# Patient Record
Sex: Female | Born: 1950 | Race: Black or African American | Hispanic: No | Marital: Married
Health system: Southern US, Community
[De-identification: ages and names within clinical notes are randomized; demographics above are authoritative.]

---

## 2005-03-30 ENCOUNTER — Other Ambulatory Visit: Payer: Self-pay

## 2005-03-30 ENCOUNTER — Emergency Department: Payer: Self-pay | Admitting: Emergency Medicine

## 2006-11-21 ENCOUNTER — Emergency Department: Payer: Self-pay | Admitting: Emergency Medicine

## 2006-11-29 ENCOUNTER — Ambulatory Visit: Payer: Self-pay | Admitting: Family Medicine

## 2006-12-07 ENCOUNTER — Other Ambulatory Visit: Payer: Self-pay

## 2006-12-07 ENCOUNTER — Ambulatory Visit: Payer: Self-pay | Admitting: Obstetrics and Gynecology

## 2006-12-12 ENCOUNTER — Ambulatory Visit: Payer: Self-pay | Admitting: Obstetrics and Gynecology

## 2007-09-04 ENCOUNTER — Ambulatory Visit: Payer: Self-pay | Admitting: Family Medicine

## 2007-10-01 ENCOUNTER — Ambulatory Visit: Payer: Self-pay | Admitting: Family Medicine

## 2007-11-14 ENCOUNTER — Ambulatory Visit: Payer: Self-pay | Admitting: Family Medicine

## 2007-12-01 ENCOUNTER — Ambulatory Visit: Payer: Self-pay | Admitting: Family Medicine

## 2008-02-27 ENCOUNTER — Ambulatory Visit: Payer: Self-pay | Admitting: Family Medicine

## 2008-07-24 ENCOUNTER — Ambulatory Visit: Payer: Self-pay | Admitting: General Surgery

## 2009-04-02 ENCOUNTER — Ambulatory Visit: Payer: Self-pay

## 2011-05-21 ENCOUNTER — Ambulatory Visit: Payer: Self-pay | Admitting: Family Medicine

## 2012-03-14 ENCOUNTER — Ambulatory Visit: Payer: Self-pay | Admitting: Family Medicine

## 2012-05-11 ENCOUNTER — Emergency Department: Payer: Self-pay | Admitting: Emergency Medicine

## 2012-05-11 LAB — COMPREHENSIVE METABOLIC PANEL
Albumin: 2.4 g/dL — ABNORMAL LOW (ref 3.4–5.0)
Anion Gap: 8 (ref 7–16)
BUN: 28 mg/dL — ABNORMAL HIGH (ref 7–18)
Chloride: 102 mmol/L (ref 98–107)
Glucose: 416 mg/dL — ABNORMAL HIGH (ref 65–99)
Potassium: 4 mmol/L (ref 3.5–5.1)
SGOT(AST): 77 U/L — ABNORMAL HIGH (ref 15–37)
Total Protein: 9 g/dL — ABNORMAL HIGH (ref 6.4–8.2)

## 2012-05-11 LAB — PROTIME-INR
INR: 1.3
Prothrombin Time: 16.4 secs — ABNORMAL HIGH (ref 11.5–14.7)

## 2012-05-11 LAB — CBC
HCT: 33.6 % — ABNORMAL LOW (ref 35.0–47.0)
HGB: 11.7 g/dL — ABNORMAL LOW (ref 12.0–16.0)
MCH: 32.8 pg (ref 26.0–34.0)
MCHC: 34.7 g/dL (ref 32.0–36.0)
RBC: 3.56 10*6/uL — ABNORMAL LOW (ref 3.80–5.20)

## 2012-05-11 LAB — URINALYSIS, COMPLETE
Bilirubin,UR: NEGATIVE
Blood: NEGATIVE
Glucose,UR: >=500 mg/dL (ref 0–75)
Ketone: NEGATIVE
Leukocyte Esterase: NEGATIVE
Nitrite: NEGATIVE
Ph: 6 (ref 4.5–8.0)
Protein: NEGATIVE
Squamous Epithelial: 6

## 2012-10-15 LAB — DRUG SCREEN, URINE
Amphetamines, Ur Screen: NEGATIVE (ref ?–1000)
Benzodiazepine, Ur Scrn: NEGATIVE (ref ?–200)
Cocaine Metabolite,Ur ~~LOC~~: NEGATIVE (ref ?–300)
Methadone, Ur Screen: NEGATIVE (ref ?–300)
Opiate, Ur Screen: NEGATIVE (ref ?–300)
Tricyclic, Ur Screen: NEGATIVE (ref ?–1000)

## 2012-10-15 LAB — COMPREHENSIVE METABOLIC PANEL
Albumin: 2.4 g/dL — ABNORMAL LOW (ref 3.4–5.0)
Alkaline Phosphatase: 262 U/L — ABNORMAL HIGH (ref 50–136)
Anion Gap: 13 (ref 7–16)
BUN: 32 mg/dL — ABNORMAL HIGH (ref 7–18)
Bilirubin,Total: 1.5 mg/dL — ABNORMAL HIGH (ref 0.2–1.0)
Calcium, Total: 8.6 mg/dL (ref 8.5–10.1)
Creatinine: 1.43 mg/dL — ABNORMAL HIGH (ref 0.60–1.30)
EGFR (African American): 46 — ABNORMAL LOW
EGFR (Non-African Amer.): 40 — ABNORMAL LOW
Glucose: 119 mg/dL — ABNORMAL HIGH (ref 65–99)

## 2012-10-15 LAB — URINALYSIS, COMPLETE
Ketone: NEGATIVE
Nitrite: NEGATIVE
Ph: 6 (ref 4.5–8.0)
Protein: NEGATIVE
RBC,UR: 4 /HPF (ref 0–5)
Specific Gravity: 1.011 (ref 1.003–1.030)
Squamous Epithelial: <1

## 2012-10-16 ENCOUNTER — Inpatient Hospital Stay: Payer: Self-pay | Admitting: Internal Medicine

## 2012-10-16 LAB — CBC
HGB: 10.3 g/dL — ABNORMAL LOW (ref 12.0–16.0)
MCHC: 32.9 g/dL (ref 32.0–36.0)
MCV: 93 fL (ref 80–100)
Platelet: 168 10*3/uL (ref 150–440)
RBC: 3.36 10*6/uL — ABNORMAL LOW (ref 3.80–5.20)

## 2012-10-16 LAB — ACETAMINOPHEN LEVEL: Acetaminophen: <2 ug/mL

## 2012-10-16 LAB — ETHANOL: Ethanol %: <0.003 % (ref 0.000–0.080)

## 2012-10-16 LAB — TROPONIN I: Troponin-I: <0.02 ng/mL

## 2012-10-17 LAB — COMPREHENSIVE METABOLIC PANEL
Albumin: 2.1 g/dL — ABNORMAL LOW (ref 3.4–5.0)
Bilirubin,Total: 1.7 mg/dL — ABNORMAL HIGH (ref 0.2–1.0)
Calcium, Total: 8.5 mg/dL (ref 8.5–10.1)
Co2: 22 mmol/L (ref 21–32)
EGFR (Non-African Amer.): 44 — ABNORMAL LOW
Glucose: 139 mg/dL — ABNORMAL HIGH (ref 65–99)
Osmolality: 293 (ref 275–301)
Potassium: 3.6 mmol/L (ref 3.5–5.1)
SGOT(AST): 263 U/L — ABNORMAL HIGH (ref 15–37)
SGPT (ALT): 120 U/L — ABNORMAL HIGH (ref 12–78)
Sodium: 142 mmol/L (ref 136–145)
Total Protein: 7.6 g/dL (ref 6.4–8.2)

## 2012-10-17 LAB — CBC WITH DIFFERENTIAL/PLATELET
Basophil #: 0.2 10*3/uL — ABNORMAL HIGH (ref 0.0–0.1)
Eosinophil #: 0.1 10*3/uL (ref 0.0–0.7)
Eosinophil %: 0.6 %
HCT: 30.9 % — ABNORMAL LOW (ref 35.0–47.0)
Lymphocyte %: 6.9 %
MCV: 93 fL (ref 80–100)
Monocyte #: 1.5 x10 3/mm — ABNORMAL HIGH (ref 0.2–0.9)
Neutrophil #: 11 10*3/uL — ABNORMAL HIGH (ref 1.4–6.5)
Neutrophil %: 80.4 %
RDW: 13.3 % (ref 11.5–14.5)

## 2012-10-17 LAB — PROTIME-INR
INR: 1.4
Prothrombin Time: 16.7 secs — ABNORMAL HIGH (ref 11.5–14.7)

## 2012-10-18 LAB — CBC WITH DIFFERENTIAL/PLATELET
Eosinophil %: 0.1 %
HGB: 10.6 g/dL — ABNORMAL LOW (ref 12.0–16.0)
Lymphocyte #: 1.4 10*3/uL (ref 1.0–3.6)
MCHC: 33.6 g/dL (ref 32.0–36.0)
MCV: 94 fL (ref 80–100)
Monocyte #: 1.2 x10 3/mm — ABNORMAL HIGH (ref 0.2–0.9)
Monocyte %: 8.1 %
RBC: 3.37 10*6/uL — ABNORMAL LOW (ref 3.80–5.20)
WBC: 15.2 10*3/uL — ABNORMAL HIGH (ref 3.6–11.0)

## 2012-10-18 LAB — COMPREHENSIVE METABOLIC PANEL
Alkaline Phosphatase: 173 U/L — ABNORMAL HIGH (ref 50–136)
BUN: 36 mg/dL — ABNORMAL HIGH (ref 7–18)
Chloride: 110 mmol/L — ABNORMAL HIGH (ref 98–107)
Co2: 23 mmol/L (ref 21–32)
Creatinine: 1.25 mg/dL (ref 0.60–1.30)
EGFR (Non-African Amer.): 47 — ABNORMAL LOW
Osmolality: 303 (ref 275–301)
SGOT(AST): 207 U/L — ABNORMAL HIGH (ref 15–37)
SGPT (ALT): 106 U/L — ABNORMAL HIGH (ref 12–78)
Sodium: 143 mmol/L (ref 136–145)

## 2012-10-18 LAB — AMMONIA: Ammonia, Plasma: 52 mcmol/L — ABNORMAL HIGH (ref 11–32)

## 2012-10-19 LAB — TSH: Thyroid Stimulating Horm: 0.956 u[IU]/mL

## 2012-10-19 LAB — COMPREHENSIVE METABOLIC PANEL
Albumin: 2.1 g/dL — ABNORMAL LOW (ref 3.4–5.0)
Alkaline Phosphatase: 168 U/L — ABNORMAL HIGH (ref 50–136)
BUN: 34 mg/dL — ABNORMAL HIGH (ref 7–18)
Calcium, Total: 8.2 mg/dL — ABNORMAL LOW (ref 8.5–10.1)
Chloride: 117 mmol/L — ABNORMAL HIGH (ref 98–107)
Co2: 23 mmol/L (ref 21–32)
Creatinine: 1.45 mg/dL — ABNORMAL HIGH (ref 0.60–1.30)
Glucose: 195 mg/dL — ABNORMAL HIGH (ref 65–99)
Osmolality: 311 (ref 275–301)
Potassium: 3 mmol/L — ABNORMAL LOW (ref 3.5–5.1)
SGOT(AST): 225 U/L — ABNORMAL HIGH (ref 15–37)
SGPT (ALT): 110 U/L — ABNORMAL HIGH (ref 12–78)

## 2012-10-19 LAB — POTASSIUM: Potassium: 3.8 mmol/L (ref 3.5–5.1)

## 2012-10-20 LAB — COMPREHENSIVE METABOLIC PANEL
Albumin: 1.8 g/dL — ABNORMAL LOW (ref 3.4–5.0)
Anion Gap: 8 (ref 7–16)
BUN: 28 mg/dL — ABNORMAL HIGH (ref 7–18)
Chloride: 121 mmol/L — ABNORMAL HIGH (ref 98–107)
Creatinine: 1.34 mg/dL — ABNORMAL HIGH (ref 0.60–1.30)
Glucose: 275 mg/dL — ABNORMAL HIGH (ref 65–99)
Osmolality: 315 (ref 275–301)
Potassium: 3.3 mmol/L — ABNORMAL LOW (ref 3.5–5.1)
SGPT (ALT): 98 U/L — ABNORMAL HIGH (ref 12–78)
Sodium: 151 mmol/L — ABNORMAL HIGH (ref 136–145)

## 2012-10-20 LAB — BASIC METABOLIC PANEL
Anion Gap: 7 (ref 7–16)
Calcium, Total: 8 mg/dL — ABNORMAL LOW (ref 8.5–10.1)
Chloride: 121 mmol/L — ABNORMAL HIGH (ref 98–107)
Co2: 22 mmol/L (ref 21–32)
Creatinine: 1.26 mg/dL (ref 0.60–1.30)
EGFR (African American): 54 — ABNORMAL LOW
Glucose: 327 mg/dL — ABNORMAL HIGH (ref 65–99)
Potassium: 3.8 mmol/L (ref 3.5–5.1)
Sodium: 150 mmol/L — ABNORMAL HIGH (ref 136–145)

## 2012-10-20 LAB — POTASSIUM: Potassium: 4.4 mmol/L (ref 3.5–5.1)

## 2012-10-20 LAB — MAGNESIUM: Magnesium: 1.6 mg/dL — ABNORMAL LOW

## 2012-10-21 LAB — CBC WITH DIFFERENTIAL/PLATELET
Basophil #: 0.1 10*3/uL (ref 0.0–0.1)
Basophil %: 0.4 %
Eosinophil #: 0.7 10*3/uL (ref 0.0–0.7)
Eosinophil %: 4.1 %
HCT: 33.4 % — ABNORMAL LOW (ref 35.0–47.0)
Lymphocyte %: 15.2 %
MCH: 31.8 pg (ref 26.0–34.0)
MCHC: 32.7 g/dL (ref 32.0–36.0)
MCV: 97 fL (ref 80–100)
Monocyte %: 8.4 %
Neutrophil #: 12.8 10*3/uL — ABNORMAL HIGH (ref 1.4–6.5)
Neutrophil %: 71.9 %
RDW: 14 % (ref 11.5–14.5)

## 2012-10-21 LAB — BASIC METABOLIC PANEL WITH GFR
Anion Gap: 9
BUN: 21 mg/dL — ABNORMAL HIGH
Calcium, Total: 8.1 mg/dL — ABNORMAL LOW
Chloride: 119 mmol/L — ABNORMAL HIGH
Co2: 21 mmol/L
Creatinine: 1.22 mg/dL
EGFR (African American): 56 — ABNORMAL LOW
EGFR (Non-African Amer.): 48 — ABNORMAL LOW
Glucose: 215 mg/dL — ABNORMAL HIGH
Osmolality: 306
Potassium: 3.4 mmol/L — ABNORMAL LOW
Sodium: 149 mmol/L — ABNORMAL HIGH

## 2012-10-21 LAB — CULTURE, BLOOD (SINGLE)

## 2012-10-21 LAB — APTT: Activated PTT: 26.1 secs (ref 23.6–35.9)

## 2012-10-21 LAB — PROTIME-INR: INR: 1.5

## 2012-10-21 LAB — POTASSIUM: Potassium: 5.3 mmol/L — ABNORMAL HIGH (ref 3.5–5.1)

## 2012-10-22 LAB — URINALYSIS, COMPLETE
Bilirubin,UR: NEGATIVE
Glucose,UR: 150 mg/dL (ref 0–75)
Ketone: NEGATIVE
Ph: 5 (ref 4.5–8.0)
RBC,UR: 18 /HPF (ref 0–5)
Specific Gravity: 1.03 (ref 1.003–1.030)
Squamous Epithelial: 1
WBC UR: 5 /HPF (ref 0–5)

## 2012-10-22 LAB — BASIC METABOLIC PANEL
BUN: 18 mg/dL (ref 7–18)
Chloride: 112 mmol/L — ABNORMAL HIGH (ref 98–107)
Creatinine: 1.19 mg/dL (ref 0.60–1.30)
EGFR (African American): 57 — ABNORMAL LOW
Glucose: 242 mg/dL — ABNORMAL HIGH (ref 65–99)
Osmolality: 289 (ref 275–301)
Potassium: 3.6 mmol/L (ref 3.5–5.1)
Sodium: 140 mmol/L (ref 136–145)

## 2012-10-23 LAB — COMPREHENSIVE METABOLIC PANEL
Albumin: 1.5 g/dL — ABNORMAL LOW (ref 3.4–5.0)
Alkaline Phosphatase: 143 U/L — ABNORMAL HIGH (ref 50–136)
Anion Gap: 6 — ABNORMAL LOW (ref 7–16)
BUN: 16 mg/dL (ref 7–18)
Calcium, Total: 7.3 mg/dL — ABNORMAL LOW (ref 8.5–10.1)
Chloride: 108 mmol/L — ABNORMAL HIGH (ref 98–107)
Co2: 21 mmol/L (ref 21–32)
Creatinine: 1.38 mg/dL — ABNORMAL HIGH (ref 0.60–1.30)
EGFR (African American): 48 — ABNORMAL LOW
Osmolality: 274 (ref 275–301)
Potassium: 3.7 mmol/L (ref 3.5–5.1)
SGOT(AST): 82 U/L — ABNORMAL HIGH (ref 15–37)
SGPT (ALT): 72 U/L (ref 12–78)
Sodium: 135 mmol/L — ABNORMAL LOW (ref 136–145)
Total Protein: 6.5 g/dL (ref 6.4–8.2)

## 2012-10-23 LAB — CBC WITH DIFFERENTIAL/PLATELET
Eosinophil %: 3 %
HCT: 28 % — ABNORMAL LOW (ref 35.0–47.0)
Lymphocyte #: 2.2 10*3/uL (ref 1.0–3.6)
MCV: 94 fL (ref 80–100)
Neutrophil #: 9.4 10*3/uL — ABNORMAL HIGH (ref 1.4–6.5)
Neutrophil %: 67.6 %
Platelet: 94 10*3/uL — ABNORMAL LOW (ref 150–440)
RDW: 13.7 % (ref 11.5–14.5)
WBC: 13.8 10*3/uL — ABNORMAL HIGH (ref 3.6–11.0)

## 2012-10-24 LAB — FOLATE: Folic Acid: 11.1 ng/mL (ref 3.1–100.0)

## 2012-10-24 LAB — CULTURE, BLOOD (SINGLE)

## 2012-10-26 LAB — PHOSPHORUS: Phosphorus: 2.2 mg/dL — ABNORMAL LOW (ref 2.5–4.9)

## 2012-10-26 LAB — BASIC METABOLIC PANEL
Anion Gap: 6 — ABNORMAL LOW (ref 7–16)
BUN: 29 mg/dL — ABNORMAL HIGH (ref 7–18)
Calcium, Total: 8.1 mg/dL — ABNORMAL LOW (ref 8.5–10.1)
Co2: 21 mmol/L (ref 21–32)
Creatinine: 1.49 mg/dL — ABNORMAL HIGH (ref 0.60–1.30)
EGFR (African American): 44 — ABNORMAL LOW
EGFR (Non-African Amer.): 38 — ABNORMAL LOW
Osmolality: 285 (ref 275–301)
Sodium: 139 mmol/L (ref 136–145)

## 2012-12-10 ENCOUNTER — Inpatient Hospital Stay: Payer: Self-pay | Admitting: Internal Medicine

## 2012-12-10 LAB — COMPREHENSIVE METABOLIC PANEL
Alkaline Phosphatase: 264 U/L — ABNORMAL HIGH (ref 50–136)
Anion Gap: 6 — ABNORMAL LOW (ref 7–16)
Calcium, Total: 7.9 mg/dL — ABNORMAL LOW (ref 8.5–10.1)
Co2: 18 mmol/L — ABNORMAL LOW (ref 21–32)
EGFR (Non-African Amer.): 22 — ABNORMAL LOW
Glucose: 32 mg/dL — CL (ref 65–99)
Osmolality: 273 (ref 275–301)
Potassium: 5 mmol/L (ref 3.5–5.1)
SGOT(AST): 66 U/L — ABNORMAL HIGH (ref 15–37)
SGPT (ALT): 15 U/L (ref 12–78)
Sodium: 136 mmol/L (ref 136–145)
Total Protein: 8.7 g/dL — ABNORMAL HIGH (ref 6.4–8.2)

## 2012-12-10 LAB — CBC WITH DIFFERENTIAL/PLATELET
Basophil #: 0.1 10*3/uL (ref 0.0–0.1)
Eosinophil #: 0.1 10*3/uL (ref 0.0–0.7)
HCT: 26.6 % — ABNORMAL LOW (ref 35.0–47.0)
Lymphocyte %: 11.4 %
MCH: 30.5 pg (ref 26.0–34.0)
Monocyte #: 1.1 x10 3/mm — ABNORMAL HIGH (ref 0.2–0.9)
Monocyte %: 8.6 %
Neutrophil #: 10.4 10*3/uL — ABNORMAL HIGH (ref 1.4–6.5)
Platelet: 161 10*3/uL (ref 150–440)
RBC: 2.86 10*6/uL — ABNORMAL LOW (ref 3.80–5.20)
WBC: 13.2 10*3/uL — ABNORMAL HIGH (ref 3.6–11.0)

## 2012-12-10 LAB — LIPASE, BLOOD: Lipase: 759 U/L — ABNORMAL HIGH (ref 73–393)

## 2012-12-10 LAB — TROPONIN I: Troponin-I: <0.02 ng/mL

## 2012-12-10 LAB — PROTIME-INR: INR: 1.4

## 2012-12-10 LAB — PRO B NATRIURETIC PEPTIDE: B-Type Natriuretic Peptide: 192 pg/mL — ABNORMAL HIGH (ref 0–125)

## 2012-12-11 LAB — COMPREHENSIVE METABOLIC PANEL
Alkaline Phosphatase: 193 U/L — ABNORMAL HIGH (ref 50–136)
BUN: 28 mg/dL — ABNORMAL HIGH (ref 7–18)
Bilirubin,Total: 1.3 mg/dL — ABNORMAL HIGH (ref 0.2–1.0)
Calcium, Total: 7.9 mg/dL — ABNORMAL LOW (ref 8.5–10.1)
Chloride: 111 mmol/L — ABNORMAL HIGH (ref 98–107)
Co2: 16 mmol/L — ABNORMAL LOW (ref 21–32)
Creatinine: 2.49 mg/dL — ABNORMAL HIGH (ref 0.60–1.30)
EGFR (African American): 24 — ABNORMAL LOW
EGFR (Non-African Amer.): 20 — ABNORMAL LOW
Glucose: 95 mg/dL (ref 65–99)
Osmolality: 277 (ref 275–301)
Potassium: 5.2 mmol/L — ABNORMAL HIGH (ref 3.5–5.1)
SGPT (ALT): 11 U/L — ABNORMAL LOW (ref 12–78)
Total Protein: 7.1 g/dL (ref 6.4–8.2)

## 2012-12-11 LAB — PHOSPHORUS: Phosphorus: 3 mg/dL (ref 2.5–4.9)

## 2012-12-11 LAB — PROTEIN / CREATININE RATIO, URINE
Creatinine, Urine: 222.7 mg/dL — ABNORMAL HIGH (ref 30.0–125.0)
Protein, Random Urine: 69 mg/dL — ABNORMAL HIGH (ref 0–12)
Protein/Creat. Ratio: 310 mg/gCREAT — ABNORMAL HIGH (ref 0–200)

## 2012-12-11 LAB — CBC WITH DIFFERENTIAL/PLATELET
Basophil #: 0.1 10*3/uL (ref 0.0–0.1)
Basophil %: 0.4 %
Eosinophil #: 0.1 10*3/uL (ref 0.0–0.7)
MCH: 31.3 pg (ref 26.0–34.0)
Monocyte #: 2 x10 3/mm — ABNORMAL HIGH (ref 0.2–0.9)
Neutrophil #: 9.1 10*3/uL — ABNORMAL HIGH (ref 1.4–6.5)
Neutrophil %: 62.7 %
Platelet: 153 10*3/uL (ref 150–440)
RBC: 2.5 10*6/uL — ABNORMAL LOW (ref 3.80–5.20)
RDW: 13.8 % (ref 11.5–14.5)
WBC: 14.6 10*3/uL — ABNORMAL HIGH (ref 3.6–11.0)

## 2012-12-11 LAB — BODY FLUID CELL COUNT WITH DIFFERENTIAL
Basophil: 0 %
Eosinophil: 0 %
Lymphocytes: 0 %
Neutrophils: 84 %
Nucleated Cell Count: 9183 /mm3
Other Mononuclear Cells: 16 %

## 2012-12-11 LAB — URINALYSIS, COMPLETE
Bilirubin,UR: NEGATIVE
Glucose,UR: NEGATIVE mg/dL (ref 0–75)
Hyaline Cast: 21
Ph: 5 (ref 4.5–8.0)
Protein: NEGATIVE
Squamous Epithelial: <1
WBC UR: 12 /HPF (ref 0–5)

## 2012-12-11 LAB — LACTATE DEHYDROGENASE, PLEURAL OR PERITONEAL FLUID: LDH, Body Fluid: 102 U/L

## 2012-12-11 LAB — PROTEIN, BODY FLUID: Protein, Body Fluid: 0.7 g/dL

## 2012-12-11 LAB — GLUCOSE, SEROUS FLUID: Glucose, Body Fluid: 106 mg/dL

## 2012-12-11 LAB — PROTIME-INR
INR: 1.7
Prothrombin Time: 19.5 secs — ABNORMAL HIGH (ref 11.5–14.7)

## 2012-12-11 LAB — HEMOGLOBIN A1C: Hemoglobin A1C: 5.9 % (ref 4.2–6.3)

## 2012-12-12 LAB — BASIC METABOLIC PANEL
BUN: 30 mg/dL — ABNORMAL HIGH (ref 7–18)
Calcium, Total: 7.9 mg/dL — ABNORMAL LOW (ref 8.5–10.1)
EGFR (African American): 23 — ABNORMAL LOW
EGFR (Non-African Amer.): 20 — ABNORMAL LOW
Glucose: 185 mg/dL — ABNORMAL HIGH (ref 65–99)
Osmolality: 281 (ref 275–301)
Sodium: 135 mmol/L — ABNORMAL LOW (ref 136–145)

## 2012-12-12 LAB — CBC WITH DIFFERENTIAL/PLATELET
Basophil %: 0.4 %
Eosinophil #: 0.3 10*3/uL (ref 0.0–0.7)
HCT: 19.8 % — ABNORMAL LOW (ref 35.0–47.0)
Lymphocyte #: 2.3 10*3/uL (ref 1.0–3.6)
Lymphocyte %: 22.2 %
MCV: 92 fL (ref 80–100)
Monocyte #: 1.7 x10 3/mm — ABNORMAL HIGH (ref 0.2–0.9)
Monocyte %: 16.6 %
Neutrophil %: 58.1 %
Platelet: 128 10*3/uL — ABNORMAL LOW (ref 150–440)
RDW: 13.7 % (ref 11.5–14.5)
WBC: 10.5 10*3/uL (ref 3.6–11.0)

## 2012-12-12 LAB — UR PROT ELECTROPHORESIS, URINE RANDOM

## 2012-12-12 LAB — CLOSTRIDIUM DIFFICILE BY PCR

## 2012-12-12 LAB — CREATININE, URINE, 24 HOUR
Creatinine, Urine: 178.3 mg/dL — ABNORMAL HIGH (ref 30.0–125.0)
Total Volume: 400 mL

## 2012-12-13 LAB — CBC WITH DIFFERENTIAL/PLATELET
Basophil %: 0.4 %
Eosinophil #: 0.5 10*3/uL (ref 0.0–0.7)
MCH: 30.9 pg (ref 26.0–34.0)
MCV: 89 fL (ref 80–100)
Monocyte #: 1.5 x10 3/mm — ABNORMAL HIGH (ref 0.2–0.9)
Monocyte %: 13.9 %
Neutrophil #: 6.6 10*3/uL — ABNORMAL HIGH (ref 1.4–6.5)
Platelet: 135 10*3/uL — ABNORMAL LOW (ref 150–440)
RBC: 2.55 10*6/uL — ABNORMAL LOW (ref 3.80–5.20)

## 2012-12-13 LAB — BASIC METABOLIC PANEL
BUN: 29 mg/dL — ABNORMAL HIGH (ref 7–18)
Calcium, Total: 8.1 mg/dL — ABNORMAL LOW (ref 8.5–10.1)
Chloride: 109 mmol/L — ABNORMAL HIGH (ref 98–107)
Co2: 21 mmol/L (ref 21–32)
Creatinine: 2.25 mg/dL — ABNORMAL HIGH (ref 0.60–1.30)
EGFR (Non-African Amer.): 23 — ABNORMAL LOW
Glucose: 106 mg/dL — ABNORMAL HIGH (ref 65–99)
Osmolality: 278 (ref 275–301)
Sodium: 136 mmol/L (ref 136–145)

## 2012-12-13 LAB — PROTEIN ELECTROPHORESIS(ARMC)

## 2012-12-14 LAB — CBC WITH DIFFERENTIAL/PLATELET
Basophil #: 0 10*3/uL (ref 0.0–0.1)
Basophil %: 0.1 %
Eosinophil #: 0.1 10*3/uL (ref 0.0–0.7)
HCT: 21.3 % — ABNORMAL LOW (ref 35.0–47.0)
HGB: 7.3 g/dL — ABNORMAL LOW (ref 12.0–16.0)
Lymphocyte #: 1.6 10*3/uL (ref 1.0–3.6)
Lymphocyte %: 11.1 %
MCH: 30.6 pg (ref 26.0–34.0)
MCHC: 34.4 g/dL (ref 32.0–36.0)
MCV: 89 fL (ref 80–100)
Monocyte #: 1.5 x10 3/mm — ABNORMAL HIGH (ref 0.2–0.9)
Monocyte %: 10.6 %
Neutrophil #: 11.1 10*3/uL — ABNORMAL HIGH (ref 1.4–6.5)
Neutrophil %: 77.6 %

## 2012-12-14 LAB — BASIC METABOLIC PANEL
Calcium, Total: 8.3 mg/dL — ABNORMAL LOW (ref 8.5–10.1)
Chloride: 111 mmol/L — ABNORMAL HIGH (ref 98–107)
Co2: 19 mmol/L — ABNORMAL LOW (ref 21–32)
EGFR (African American): 28 — ABNORMAL LOW
Osmolality: 284 (ref 275–301)
Sodium: 139 mmol/L (ref 136–145)

## 2012-12-15 LAB — BASIC METABOLIC PANEL
Anion Gap: 10 (ref 7–16)
BUN: 29 mg/dL — ABNORMAL HIGH (ref 7–18)
BUN: 26 mg/dL — ABNORMAL HIGH (ref 7–18)
Calcium, Total: 8.4 mg/dL — ABNORMAL LOW (ref 8.5–10.1)
Calcium, Total: 8.1 mg/dL — ABNORMAL LOW (ref 8.5–10.1)
Co2: 21 mmol/L (ref 21–32)
Co2: 20 mmol/L — ABNORMAL LOW (ref 21–32)
EGFR (African American): 26 — ABNORMAL LOW
EGFR (African American): 30 — ABNORMAL LOW
EGFR (Non-African Amer.): 23 — ABNORMAL LOW
Glucose: 29 mg/dL — CL (ref 65–99)
Glucose: 186 mg/dL — ABNORMAL HIGH (ref 65–99)
Potassium: 4.3 mmol/L (ref 3.5–5.1)
Sodium: 138 mmol/L (ref 136–145)
Sodium: 137 mmol/L (ref 136–145)

## 2012-12-15 LAB — CBC WITH DIFFERENTIAL/PLATELET
Basophil #: 0 10*3/uL (ref 0.0–0.1)
Basophil %: 0.3 %
Eosinophil #: 0.2 10*3/uL (ref 0.0–0.7)
Eosinophil %: 1.1 %
HGB: 7.9 g/dL — ABNORMAL LOW (ref 12.0–16.0)
Lymphocyte #: 1.7 10*3/uL (ref 1.0–3.6)
Lymphocyte %: 11.2 %
MCHC: 33.2 g/dL (ref 32.0–36.0)
RDW: 15 % — ABNORMAL HIGH (ref 11.5–14.5)

## 2012-12-16 LAB — CBC WITH DIFFERENTIAL/PLATELET
Basophil #: 0.2 10*3/uL — ABNORMAL HIGH (ref 0.0–0.1)
Basophil %: 0.7 %
Eosinophil #: 0.3 10*3/uL (ref 0.0–0.7)
Eosinophil %: 1.1 %
HGB: 9.1 g/dL — ABNORMAL LOW (ref 12.0–16.0)
MCV: 93 fL (ref 80–100)
Monocyte #: 1.9 x10 3/mm — ABNORMAL HIGH (ref 0.2–0.9)
Monocyte %: 6.6 %
Neutrophil %: 82.5 %
Platelet: 161 10*3/uL (ref 150–440)
RBC: 2.84 10*6/uL — ABNORMAL LOW (ref 3.80–5.20)
WBC: 29.3 10*3/uL — ABNORMAL HIGH (ref 3.6–11.0)

## 2012-12-16 LAB — COMPREHENSIVE METABOLIC PANEL
Anion Gap: 9 (ref 7–16)
Bilirubin,Total: 2.2 mg/dL — ABNORMAL HIGH (ref 0.2–1.0)
Creatinine: 2.05 mg/dL — ABNORMAL HIGH (ref 0.60–1.30)
EGFR (African American): 30 — ABNORMAL LOW
Glucose: 330 mg/dL — ABNORMAL HIGH (ref 65–99)
Osmolality: 279 (ref 275–301)
Total Protein: 7 g/dL (ref 6.4–8.2)

## 2012-12-31 DEATH — deceased

## 2014-06-10 IMAGING — CT CT ABD-PELV W/O CM
1 of 2 series · 14 of 32 positions shown, 18 images · non-contrast
Comparison: none

REASON FOR EXAM: (1) known liver disease with mass /sarcoidosis, s/p
recent microwave ablation [place]
COMMENTS:

PROCEDURE:     CT  - CT ABDOMEN AND PELVIS W[DATE]  [DATE]
RESULT:     Comparison: None
TECHNIQUE: Multiple axial images from the lung bases to the symphysis pubis
were obtained without oral and without intravenous contrast.

[Series 2: 3mm soft tissue · axial · 1.27mm/px · z∈[-503,-44]mm · 14 of 167 slices shown, 18 images]
[im 7/167  soft-tissue]
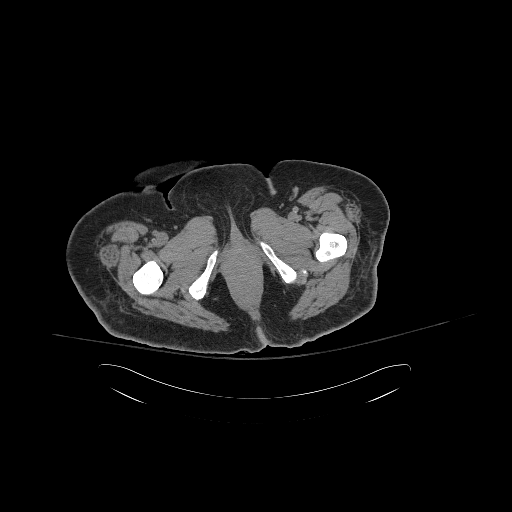
[im 7/167  bone]
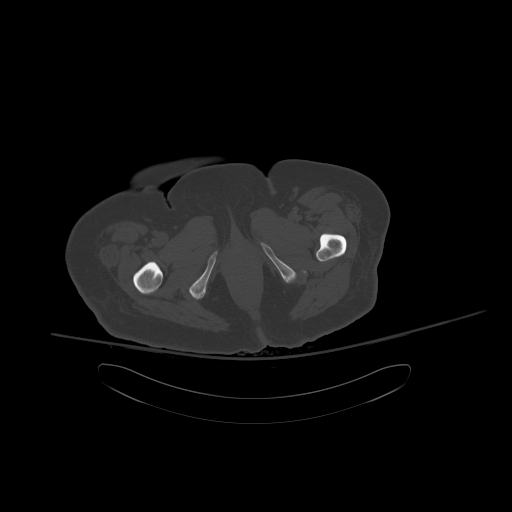
[im 21/167  soft-tissue]
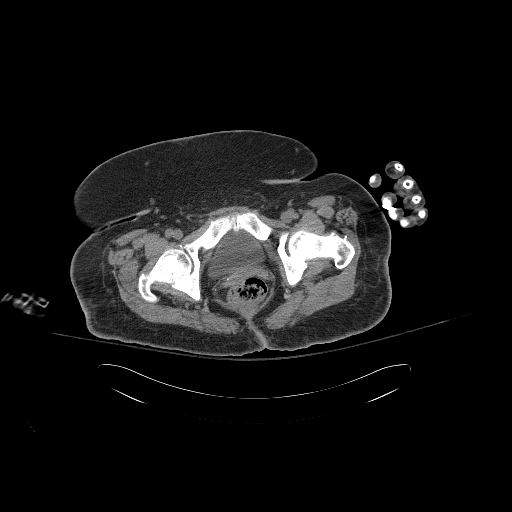
[im 35/167  soft-tissue]
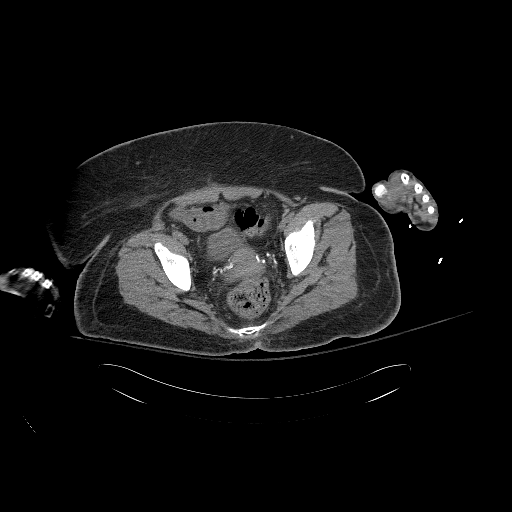
[im 49/167  soft-tissue]
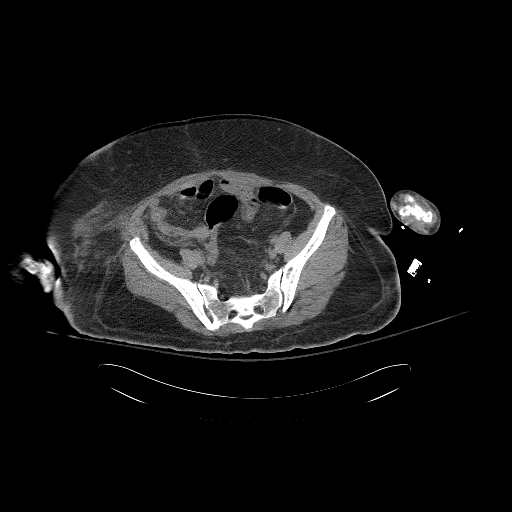
[im 63/167  soft-tissue]
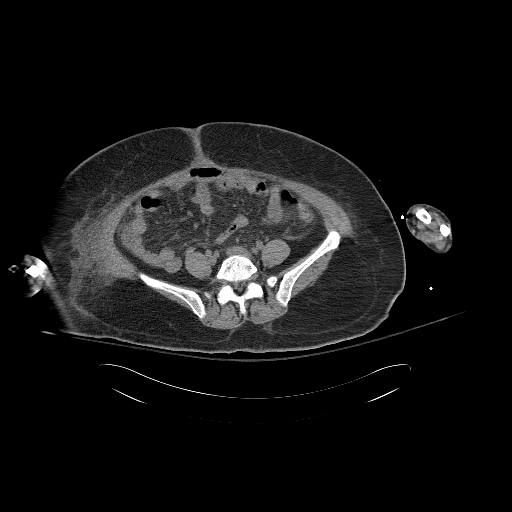
[im 77/167  soft-tissue]
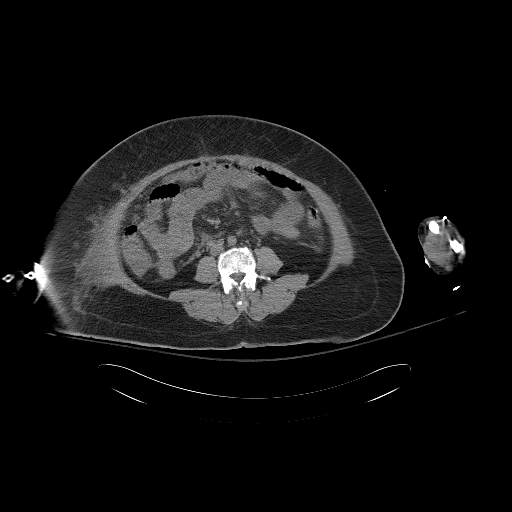
[im 90/167  soft-tissue]
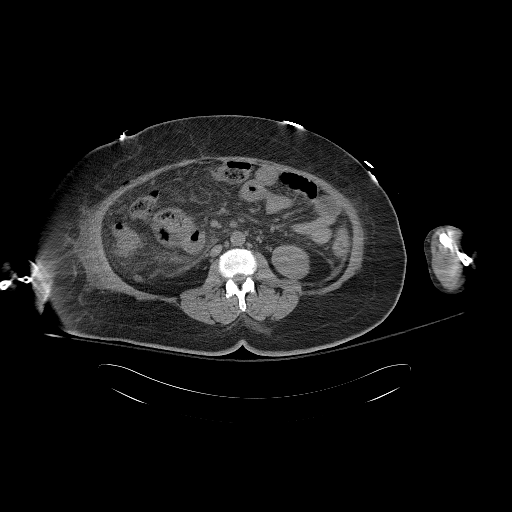
[im 104/167  soft-tissue]
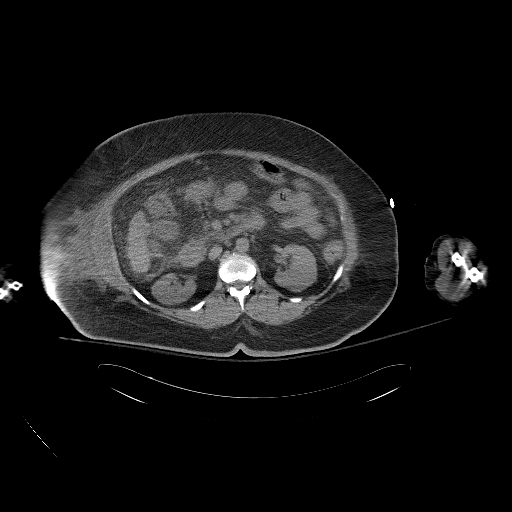
[im 118/167  soft-tissue]
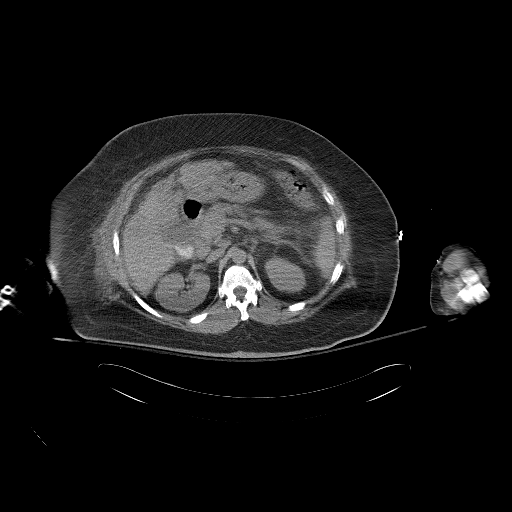
[im 118/167  bone]
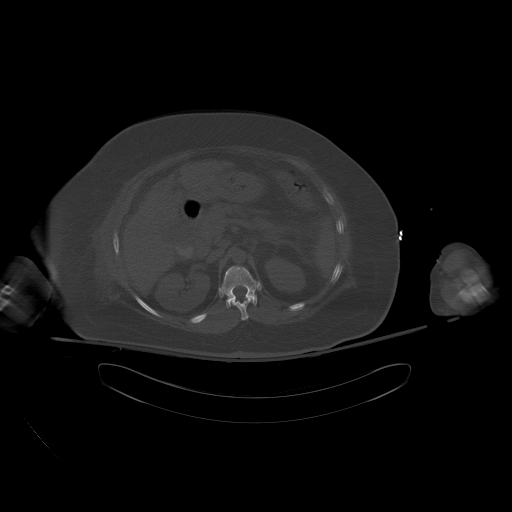
[im 132/167  soft-tissue]
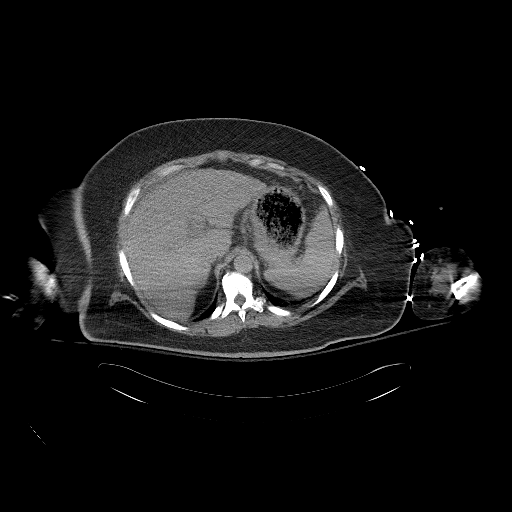
[im 139/167  lung]
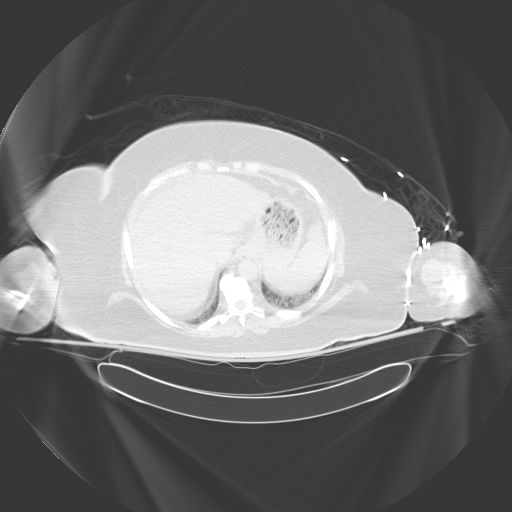
[im 146/167  soft-tissue]
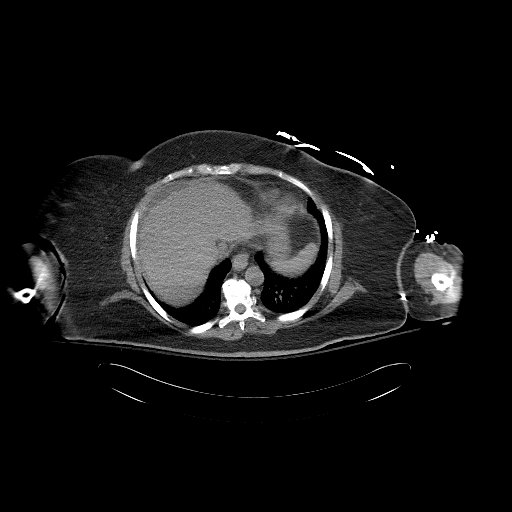
[im 146/167  lung]
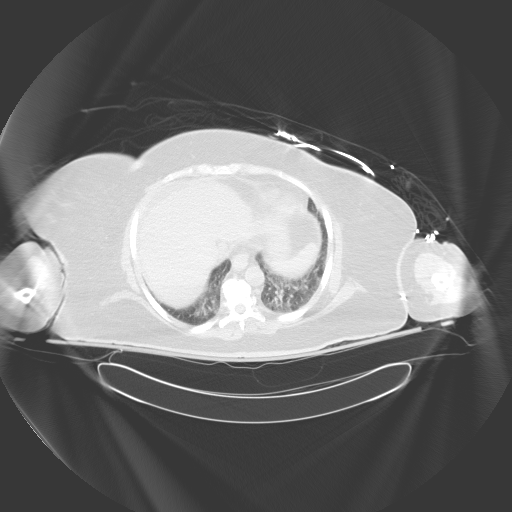
[im 153/167  lung]
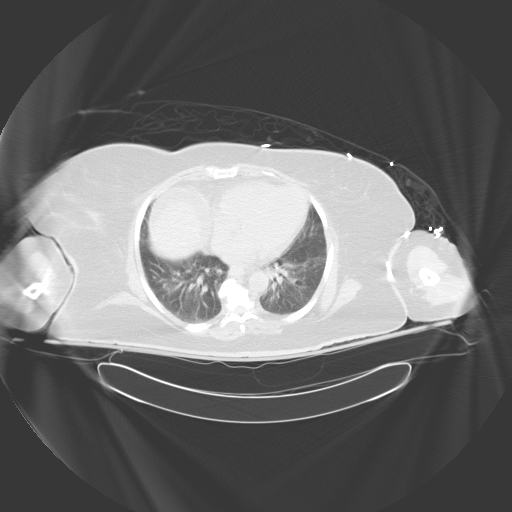
[im 160/167  soft-tissue]
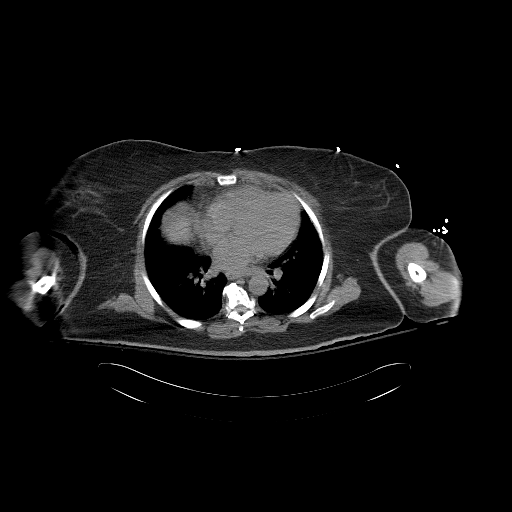
[im 160/167  lung]
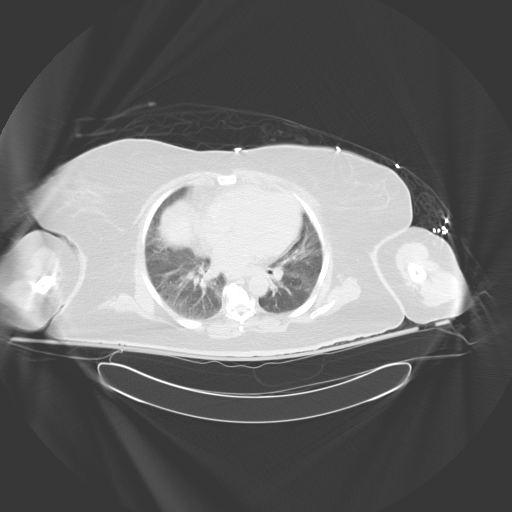

[14 of 32 positions shown; findings below may reference images not displayed]

FINDINGS: Mild basilar opacities are likely secondary to atelectasis.

Examination is limited by patient motion artifact as well as lack of
intravenous and oral contrast. Lack of intravenous contrast limits
evaluation of the solid abdominal organs.  The liver is somewhat nodular in
contour, raising the possibility of cirrhosis. Low-attenuation in the
posterior aspect of the right hepatic lobe is likely secondary to artifact.
However, correlation with site of the reported ablation is recommended.
There is a small amount of perihepatic fluid. Stones are present within the
gallbladder. There is pericholecystic fluid. There is mild thickening of the
musculature of the right lateral abdominal wall. This may be related to post
treatment changes. Infection is not excluded. There is mild adjacent fluid
density. There are foci of air along the fat fascial interface of the right
anterior lateral abdominal wall. The spleen, adrenals, and pancreas are
unremarkable. No renal calculi or hydronephrosis.

The small and large bowel are normal in caliber. The appendix is normal in
size. There is mild diffuse mesenteric stranding, which is nonspecific.
There is a small focus of air along the umbilicus, just anterior to the
abdominal wall. This is nonspecific.

No aggressive lytic or sclerotic osseous lesions are identified.
IMPRESSION: 1. Limited examination. Low-attenuation in the posterior aspect of the right
hepatic lobe is likely artifactual. However, correlate with the site of
reported liver ablation as well as the preprocedural imaging.
2. There is mild thickening of the right lateral abdominal wall musculature
as well as adjacent fluid density. There are several foci of air within this
region. This could be related to prior procedure/hemorrhage if this is in
the appropriate location and time interval. Infection cannot be excluded.
There is a subtle focus of air within the soft tissues along the umbilicus
just anterior to the abdominal wall. This is nonspecific as well. Correlate
with the procedural details.
3. Cholelithiasis. There is pericholecystic fluid which could be related to
the mild perihepatic fluid. However, correlate for cholecystitis.

## 2014-06-10 IMAGING — CT CT HEAD WITHOUT CONTRAST
3 of 4 series · 17 of 30 positions shown, 19 images · non-contrast
Comparison: none

REASON FOR EXAM: unresponsive
COMMENTS:

PROCEDURE:     CT  - CT HEAD WITHOUT CONTRAST  - October 16, 2012 [DATE]
RESULT:     Comparison:  None
TECHNIQUE: Multiple axial images from the foramen magnum to the vertex were
obtained without IV contrast.

[Series 2: without · axial · non-contrast · 0.44mm/px · z∈[-145,-85]mm · 4 of 20 slices shown (1 of 2)]
[im 4/20  brain]
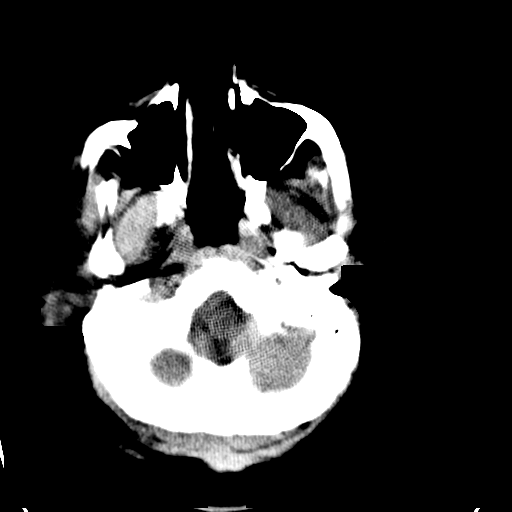
[im 8/20  brain]
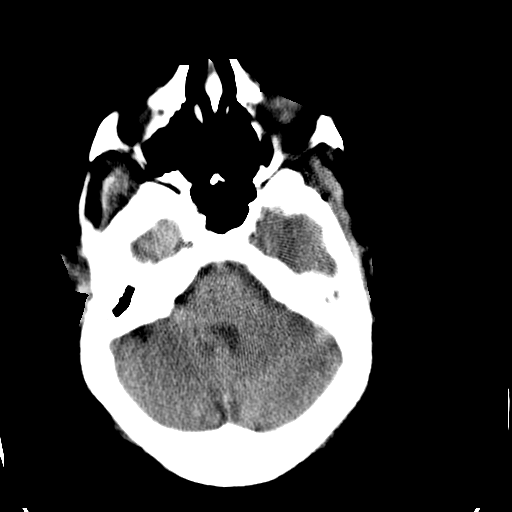
[im 12/20  brain]
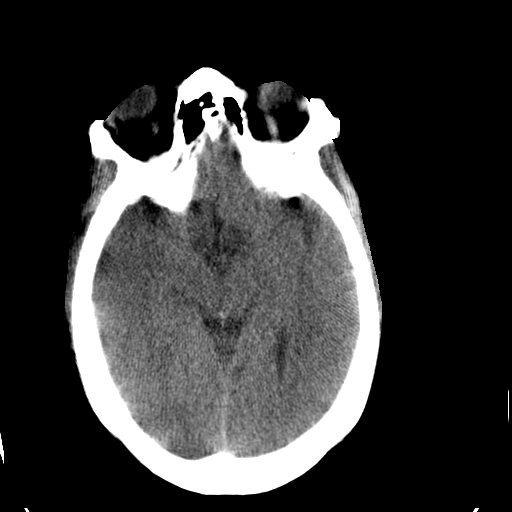
[im 16/20  brain]
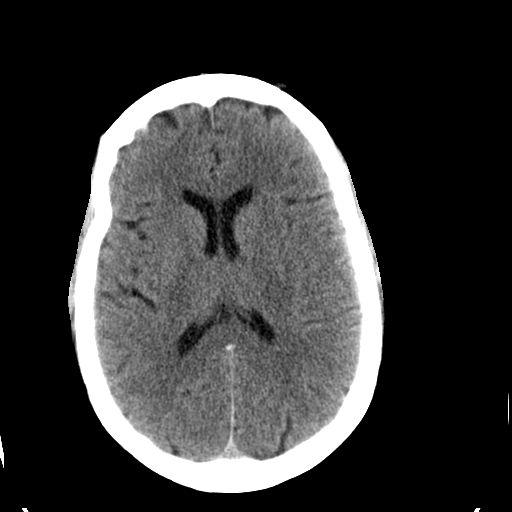

[Series 4: without · axial · non-contrast · 0.44mm/px · z∈[-145,-35]mm · 7 of 30 slices shown, 9 images (2 of 2)]
[im 4/30  brain]
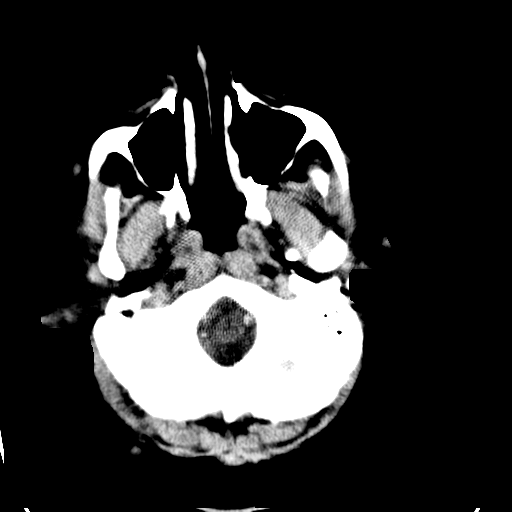
[im 4/30  bone]
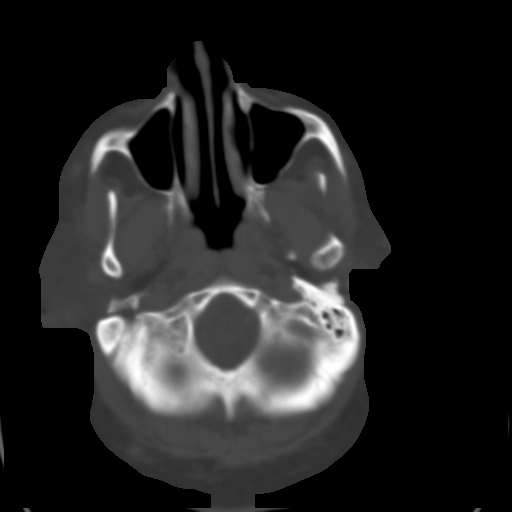
[im 8/30  brain]
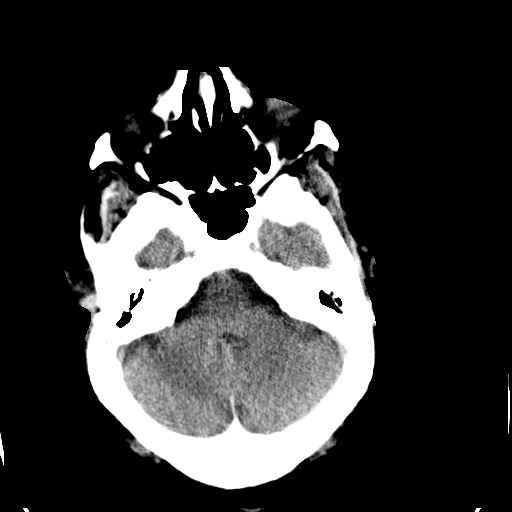
[im 11/30  brain]
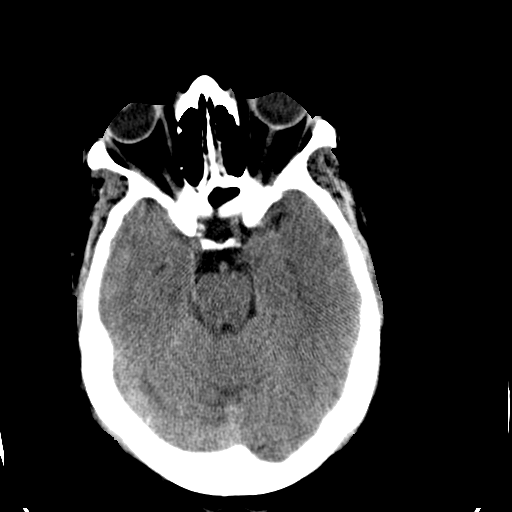
[im 15/30  brain]
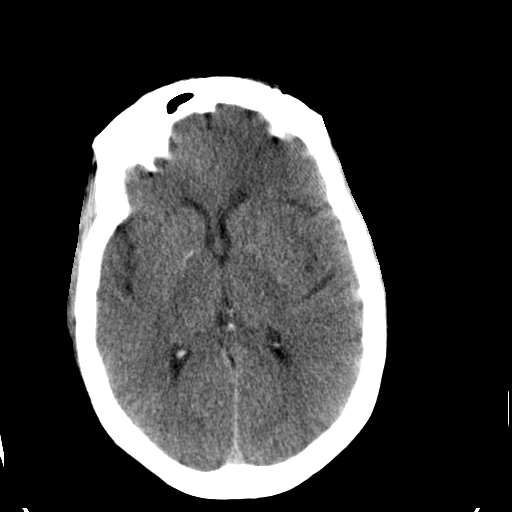
[im 19/30  brain]
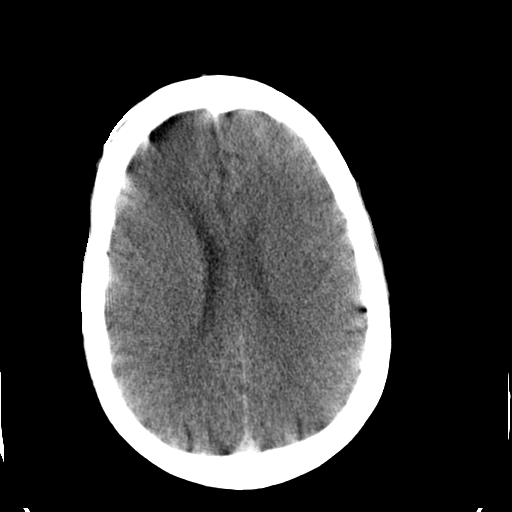
[im 19/30  bone]
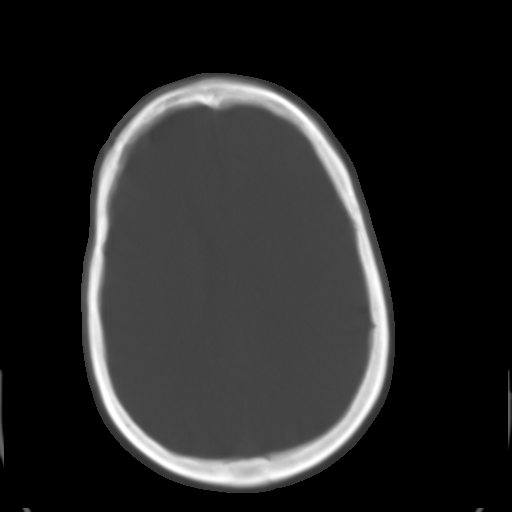
[im 22/30  brain]
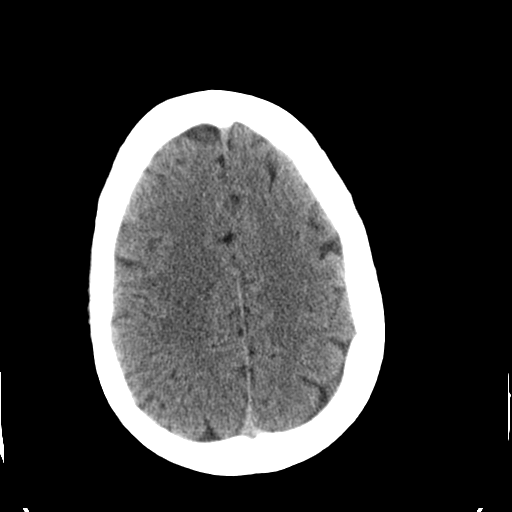
[im 26/30  brain]
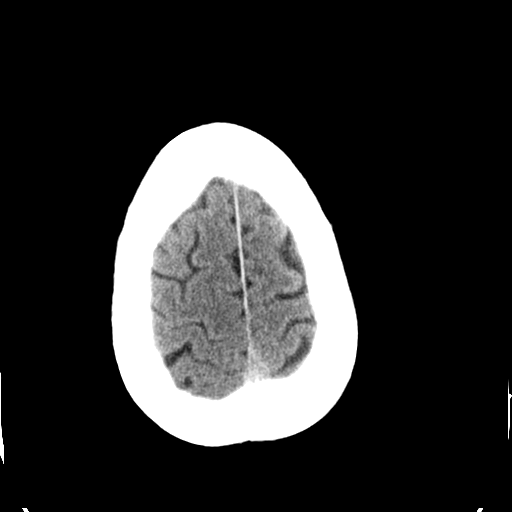

[Series 5: bone · axial · 0.44mm/px · z∈[-145,-55]mm · 6 of 30 slices shown]
[im 4/30  bone]
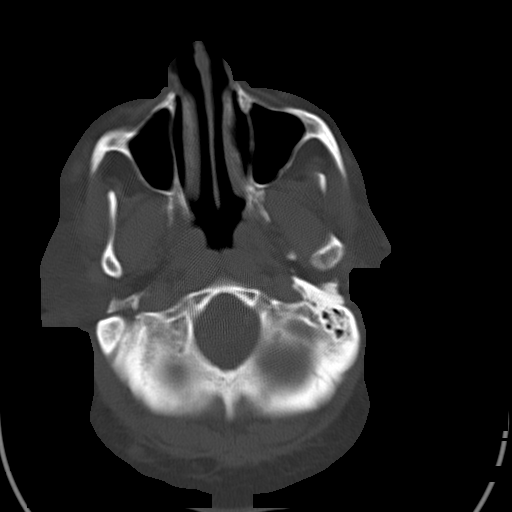
[im 8/30  bone]
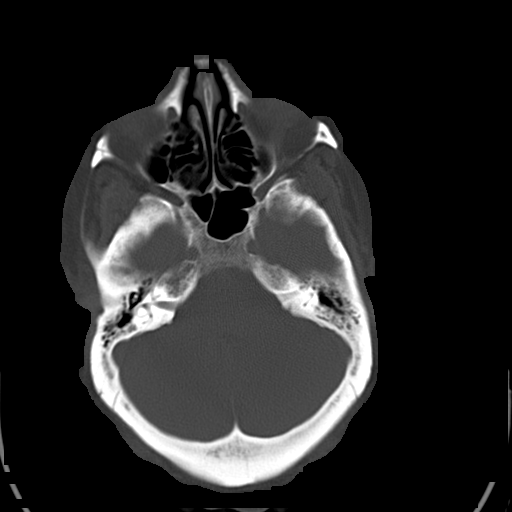
[im 11/30  bone]
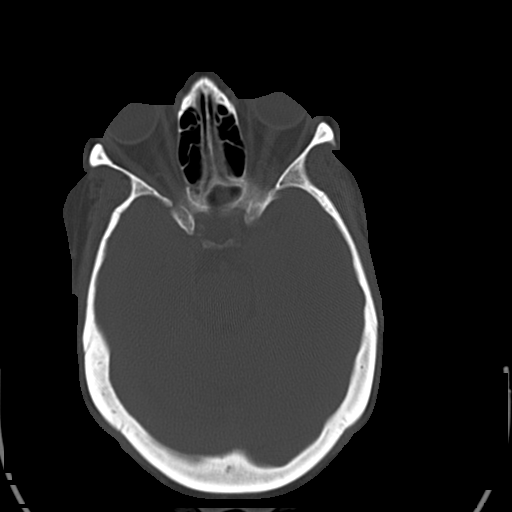
[im 15/30  bone]
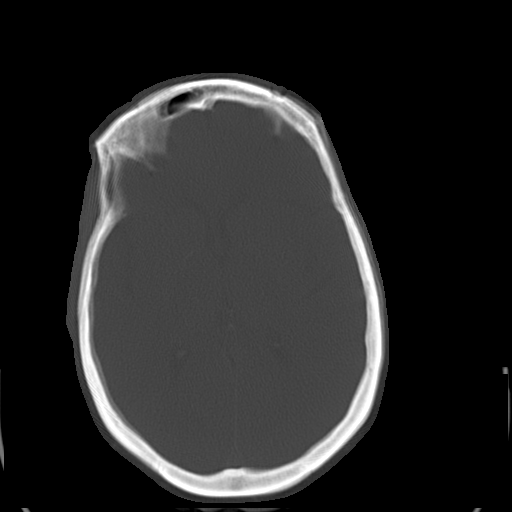
[im 19/30  bone]
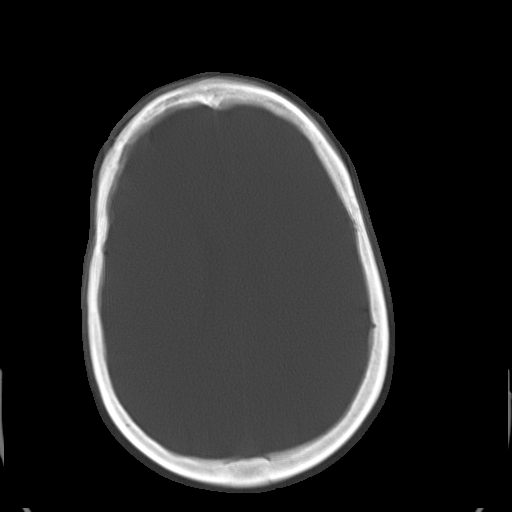
[im 22/30  bone]
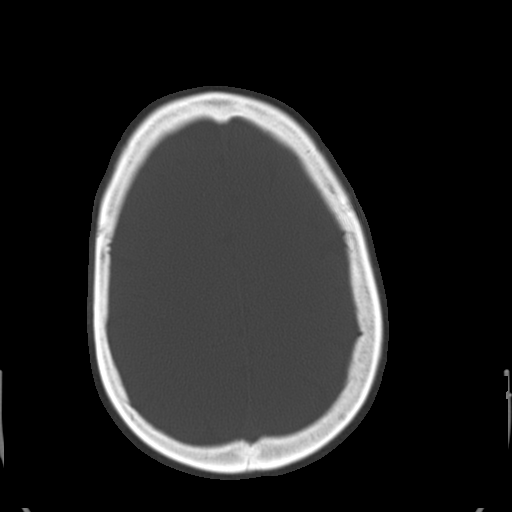

[17 of 30 positions shown; findings below may reference images not displayed]

FINDINGS: Examination is limited secondary to patient motion degrading image quality.

There is no evidence of mass effect, midline shift, or extra-axial fluid
collections.  There is no evidence of a space-occupying lesion or
intracranial hemorrhage. There is no evidence of a cortical-based area of
acute infarction. There are right basal ganglia calcifications. There is
periventricular white matter low attenuation likely secondary to
microangiopathy.

The ventricles and sulci are appropriate for the patient's age. The basal
cisterns are patent.

Visualized portions of the orbits are unremarkable. The visualized portions
of the paranasal sinuses and mastoid air cells are unremarkable.

The osseous structures are unremarkable.
IMPRESSION: Limited evaluation secondary to patient motion degrading image quality.
Repeat sequences also have motion. If there is further clinical concern
recommend repeat CT of the brain.

Otherwise no acute intracranial process.

[REDACTED]

## 2014-11-22 NOTE — Discharge Summary (Signed)
PATIENT NAME:  Katelyn [name], BURKMAN MR#:  [id number] DATE OF BIRTH:  Feb 27, 1951  DATE OF ADMISSION:  12/10/2012 DATE OF SUMMARY:  12/20/2012  ADDENDUM  PRIMARY CARE PHYSICIAN:  Dr. Dory Horn at [facility], Dr. Baltazar Apo at [facility].  Please see interim summary dictated by Dr. Verdell Carmine on 12/15/2012 for hospital course from 12/10/2012 to 12/15/2012.  This interim is from the evening of May 16th through the morning of May 17th.   CURRENT MEDICATIONS INCLUDE:  Diprivan titrate to sedation, Lasix drip 1 mg/mL at 5 mL/hour, Versed drip, titrate to sedation, Levophed drip, titrate to systolic blood pressure greater than 100, Tylenol 650 mg orally q.4 hours, Maalox 30 mL orally t.i.d., Sinemet 25/100 one tablet b.i.d., Colace 100 mg b.i.d., heparin subcutaneous injection 5000 units q.12 hours, Zofran 4 mg IV push q.4 hours, oxycodone 5 mg every 6 hours p.r.n. pain, potassium phosphate one packet q.12 hours, Phenergan 6.25 to 12.5 mg IV q.4 hours p.r.n., Senokot 1 tablet b.i.d., Zoloft 50 mg daily, ferrous fumarate 324 mg daily, Novolin-R sliding scale, gabapentin 800 mg t.i.d., Anusol rectal cream one application daily, chlorhexidine kit 5 mL q.12 hours, normal saline line flush and heparin line flush daily, Protonix 40 mg IV daily, Rocephin 1 gram IV piggyback q.24 hours, glycopyrrolate 0.4 mg IV push q.3 hours. The patient is n.p.o. The patient was intubated on the afternoon of 12/15/2012, was on 100% oxygen. This morning when I saw her, I did give a stat dose of Lasix 80 mg IV once and Lasix drip was started. The patient did have ultrasound of bilateral lower lung fields that showed no significant effusion on the left, a small right pleural effusion. Chest x-ray showed worsening bilateral interstitial infiltrates obscuring the heart borders and hemidiaphragms. The patient's white count was up at 29.3, hemoglobin 9.1, creatinine 2.05, sodium 130, glucose of 330. On the evening of May 16th, Vascular Surgery, Dr.  Lucky Cowboy, did place a dialysis catheter. Dr. Raul Del did see the patient in consultation from the pulmonary standpoint.   VITAL SIGNS:  At the time of dictation included a pulse of 72, respirations 14, blood pressure 88/45, pulse ox 94% on the ventilator. I did speak with the family and they requested transfer over to their physicians at [facility] where previous workup for her liver was done. I did speak with a Dr. at [facility] who did accept the patient and they will transfer the patient over when they do have the bed. The patient remains critically ill.   TIME SPENT ON PATIENT CARE TODAY:  45 minutes.  ____________________________ Tana Conch. Leslye Peer, MD rjw:jm D: 12/15/2012 14:57:17 ET T: 12/11/2012 15:17:06 ET JOB#: [id number]  cc: Tana Conch. Leslye Peer, MD, <Dictator> Marisue Brooklyn MD ELECTRONICALLY SIGNED 12/22/2012 13:26

## 2014-11-22 NOTE — Discharge Summary (Signed)
PATIENT NAME:  AVRYL, ROEHM MR#:  [id number] DATE OF BIRTH:  02/01/1951  DATE OF ADMISSION:  10/16/2012 DATE OF DISCHARGE:  10/17/2012   NOTE: The patient is being transferred to [facility] for further ongoing care.   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   MEDICATIONS ON ADMISSION: Amlodipine 2.5 mg daily, aspirin 81 mg daily, atenolol 100 mg daily, coenzyme Q 1 cap daily, Lasix 20 mg daily, gabapentin 800 mg t.i.d., glipizide XL 10 mg daily, Humalog 10 units t.i.d. before meals, hydrochlorothiazide/losartan 25/100 one tab daily, hydroxyzine 25 mg daily, Levemir 15 units subcutaneous b.i.d., metformin 1000 mg b.i.d., gabapentin 100 mg t.i.d., oxycodone 5 mg q.i.d. as needed, meloxicam 15 mg daily, Zoloft 100 mg daily.   CURRENT MEDICATIONS: Precedex drip, atenolol 50 mg daily, insulin sliding scale, lactulose 30 mL q.6 hours, Zofran 4 mg q.6 hours and Zosyn 3.375 q.8 hours.   CONSULTANTS DURING HOSPITAL COURSE: Dr. Marshia Ly from general surgery.   PERTINENT STUDIES DONE DURING THE HOSPITAL COURSE: Chest x-ray done on admission showing bilateral pulmonary hypoinflation, no evidence of congestive heart failure or pneumonia. A CT scan of the abdomen and pelvis done without contrast showing limited examination, low attenuation in the posterior aspect of the right hepatic lobe likely artifactual, mild thickening of the right lateral abdominal wall musculature as well as adjacent fluid density. There are several foci of air within this region; this could be related to prior procedure and hemorrhage if this is an appropriate location and time interval. Infection cannot  be excluded. Subtle focus of air within the soft tissues along the umbilicus just anterior to the abdominal wall. This is nonspecific. Cholelithiasis. There is pericholecystic fluid which could be related to mild perihepatic fluid; however, correlate with cholecystitis. A CT scan of the head done without contrast on admission showing no evidence of any acute intracranial process.   HOSPITAL COURSE: This is a 64 year old female with medical problems as mentioned above, presented to the hospital on 10/16/2012 secondary to altered mental status, lethargy and encephalopathy.  1.  Encephalopathy: The likely source of encephalopathy is likely secondary to hepatic encephalopathy. The patient's ammonia was 202 on admission. She was advised to stop her lactulose shortly after she had her microwave ablation procedure done for her hepatic mass. Therefore, an NG tube was placed and the patient was started on lactulose q.6 hours. She continued to be restless and agitated. Therefore, she had to be transferred to the Intensive Care Unit and started on a Precedex drip. She currently remains on a Precedex drip. This morning, she was weaned off the Precedex drip but continued to be significantly lethargic. Therefore, an arterial blood gas was obtained, which showed no evidence of any CO2 retention or narcosis. Her CT scan on admission has been essentially negative. The patient received some Ativan earlier this morning, which could be clouding her mental status at this time, but the most likely source of her encephalopathy still remains to be hepatic in nature. Her ammonia has come down to 68 after getting some lactulose. Her mental status still further needs to be followed. I would avoid benzos on this patient. They would probably make her encephalopathy worse. There has been no other evidence of any other metabolic or infectious  source of her encephalopathy.  2.  Hepatic encephalopathy: This is likely secondary to the underlying  chronic liver disease secondary to hepatitis C and liver cirrhosis. The patient has been receiving lactulose. Her ammonia has come down from 202 to 68. Her mental status and ammonia further need to be followed.  3.  Abnormal liver function tests: This is likely related to her chronic liver disease and recent microwave ablation of the liver mass. The patient's CT scan of the abdomen and pelvis did show some free fluid, although a surgical consult was obtained and after their evaluation of the CT scan, they did not think that the patient needed any acute surgical intervention regarding possible cholecystitis, as she clinically does not have those symptoms. She is currently empirically on Zosyn, but unlikely that she has cholangitis or cholecystitis. Most likely, the changes on her CT scan and her liver function test abnormalities are related to her recent [facility]. She is therefore being transferred to the hepatology service/liver service at [facility].  4.  Hypertension: The patient has remained hemodynamically stable in the hospital. Therefore, her p.o. meds have been held.  5.  Diabetes: The patient is being controlled on sliding scale insulin, as the patient is currently n.p.o. for now. Her other p.o. medications are currently on hold given her encephalopathy and her mental status change and this further needs to be addressed once her mental status improves.   CODE STATUS: The patient is a full code.   DISPOSITION: [facility] is [facility] care and evaluation.   TIME SPENT: 45 minutes.    ____________________________ Rolly PancakeVivek J. Cherlynn KaiserSainani, MD vjs:jm D: 10/17/2012 17:31:39 ET T: 10/17/2012 18:54:36 ET JOB#: [id number]  cc: Rolly PancakeVivek J. Cherlynn KaiserSainani, MD, <Dictator> Houston SirenVIVEK J Lopez Dentinger MD ELECTRONICALLY SIGNED 10/29/2012 12:30

## 2014-11-22 NOTE — Discharge Summary (Signed)
PATIENT NAME:  Katelyn [name], Katelyn [name] MR#:  [id number] DATE OF BIRTH:  12-25-1950  DATE OF ADMISSION:  10/16/2012 DATE OF DISCHARGE:  10/26/2012  DISPOSITION:  Pending insurance authorization.  PRESENTING COMPLAINT: Unresponsiveness.   DISCHARGE DIAGNOSES: 1.  Acute encephalopathy of unclear etiology other than hepatic encephalopathy in the setting of pneumonia and electrolyte abnormalities with hyponatremia, improved.  2.  Fever due to right lower lobe pneumonia, completed IV Zosyn, 7 days.  3.  Hepatic encephalopathy, resolving. Ammonia back to normal.  4.  Abnormal liver function tests, likely related to chronic liver disease and recent microwave ablation of liver mass. Follow up with [facility], Dr. Dory Horn, after discharge from rehab.  5.  Hypertension.  6.  Type 2 diabetes, on insulin.  7.  Electrolyte abnormality with hypernatremia, hyponatremia, hypomagnesemia and hypophosphatemia, improving.  8.  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  9.  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  CODE STATUS: FULL CODE.   DISCHARGE INSTRUCTIONS AND PLANS:   Physical therapy.  Glucerna shakes b.i.d. ADA 2000 calorie diet. Follow up with [facility], Dr. Dory Horn, on your scheduled appointment. Follow up with Dr. Denton Lank at [facility] in 2 to 3 weeks.   DISCHARGE MEDICATIONS: 1.  Sinemet 25/100 one tablet b.i.d. - new medication.  2.  Mylanta suspension 30 mL t.i.d. p.r.n.  3.  Lactulose 30 mL b.i.d.  4.  Oxycodone 5 mg q.i.d. p.r.n.  5.  Multivitamin p.o. daily.  6.  Atenolol 100 mg daily, hold if SBP less than 110.  7.  Losartan 50 mg at bedtime.  8.  Sliding scale insulin.  9.  Neutra-Phos powder packet 1 packet b.i.d. for 3 days. 10.  Insulin Detemir 50 units sub-Q b.i.d.  11.  Loperamide 2 mg q.i.d. p.r.n. for diarrhea.  12.  Anusol-HC 8.3% rectal application t.i.d. p.r.n.  13.  Haldol 2 mg t.i.d.  p.r.n. for agitation and confusion.  14.  Aspirin chewable 81 mg daily.  15.  Glipizide 10 mg daily.  16.  Zoloft 50 mg daily.   DISCHARGE LABORATORY DATA:  Glucose 125, BUN 29, creatinine 149, sodium 139, potassium 4.3, chloride 112, bicarbonate 21 and calcium 8.5. Magnesium 1.8.  Phosphorus 2.2. Vitamin B12 1999. ESR 104. Folic acid 66.2. CRP is elevated at 99.8. Plasma ammonia 26.  CBC: H and H is 10.9 and 33.4 and platelet count 147. PT-INR 17.7 and 1.5.   BRIEF SUMMARY OF HOSPITAL COURSE: Ms. Pitner is a pleasant 64 year old morbidly obese African American female with history of chronic liver disease who has history of hepatitis C with history of liver mass who underwent recent microwave ablation at [facility] and history of hepatic encephalopathy, diabetes and hypertension who came in with:  1.  Altered mental status and encephalopathy. The patient was admitted in the intensive care unit. NG tube was placed in and she got lactulose which caused significant diarrhea requiring rectal tube. Her ammonia came down from 200s to 26. Her mentation slightly started improving. She had periods of agitation requiring IV Precedex drip, which was discontinued. PRN Haldol was used for confusion and agitation. She was moved out from the intensive care unit on a regular floor. Benzodiazepines and other deliriogenic medications were held. CT x 3 remained negative. Given the patient's fluctuating mentation, Dr. Tamala Julian from neurology saw the patient and  recommended EEG which did not show any epileptogenic seizures. Dr. Tamala Julian recommended starting Sinemet given symptoms and clinical presentation with possible mild  early parkinsonism.  2.  Fever secondary to right lower lobe pneumonia, likely aspiration. The patient completed IV Zosyn for 7 days. Blood cultures remained negative. The patient was evaluated by speech, did not see any signs of aspiration.  3.  Hepatic encephalopathy. Held lactulose initially due to diarrhea. However,  once ammonia was normalized and diarrhea had resolved, the patient was back on her routine lactulose twice a day.  4.  Abnormal LFTs secondary to chronic liver disease with recent microware ablation of mass.  CT of abdomen and pelvis findings were noted. No surgical input recommended. No intervention at this time. The patient will follow up with [facility] after rehab.  5.  Hypertension along with tachycardia. The patient was started on metoprolol around-the-clock. Since her blood pressure is stable and tachycardia improved will put her back on her atenolol 100 mg daily.  6.  Type 2 diabetes. Insulin Detemir was continued. PO glipizide was also resumed prior to discharge.  7.  Electrolyte abnormality with high sodium, low potassium, low magnesium and low phosphorus. Have been repleted.  8.  Nutrition. The patient had poor p.o. intake due to her altered mental status. Dietitian was involved. Glucerna shakes were started. At one point we were planning on putting a Dobbhoff tube for oral feeding, however, we held it off since the patient's mentation cleared and she was slowly improving with her diet. Will recommend dietician to follow at the rehab place.  9.  Generalized weakness, more in the proximal muscles, with multiple medical problems and CCU stay for more than 10 days. 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 PT evaluation was done and recommended SNF. Will be discharging to rehab for further treatment. The discharge plan was discussed with the patient's daughters who were agreeable to it. The patient remained a FULL CODE.  TIME SPENT: 40 minutes.  ____________________________ Hart Rochester Posey Pronto, MD sap:sb D: 10/26/2012 13:34:28 ET T: 10/26/2012 14:03:33 ET JOB#: [id number]  cc: Seydina Holliman A. Posey Pronto, MD, <Dictator> Sarah "Sallie" Posey Pronto, MD Mila Homer Tamala Julian, MD  Dr. Dory Horn [facility]  Adamary Savary Hulda Humphrey MD ELECTRONICALLY SIGNED 11/03/2012 15:43

## 2014-11-22 NOTE — Op Note (Signed)
PATIENT NAME:  Katelyn [name], Katelyn [name] MR#:  [id number] DATE OF BIRTH:  [birth year]  DATE OF PROCEDURE:  12/15/2012  PREOPERATIVE DIAGNOSES:  1. Renal failure, acute on chronic.  2. Volume overload.  3. Congestive heart failure. 4. Morbid obesity.   POSTOPERATIVE DIAGNOSES:  1. Renal failure, acute on chronic.  2. Volume overload.  3. Congestive heart failure. 4. Morbid obesity.   PROCEDURES:  1. Ultrasound guidance for vascular access, left femoral vein.  2. Placement of left femoral Trialysis-type dialysis catheter, 30 cm in length.   SURGEON: Annice NeedyJason S. Dew, MD  ANESTHESIA: Local.   ESTIMATED BLOOD LOSS: 25 mL.   INDICATION FOR PROCEDURE: [name] 64 year old African-American female with chronic kidney disease, with worsening renal failure and now with volume overload. She has had to be emergently intubated. We are asked to place [name] dialysis catheter for initiation of dialysis.   DESCRIPTION OF THE PROCEDURE: The patient is laid flat in her critical care bed. Groins were sterilely prepped and draped, and [name] sterile surgical field was created. The left femoral vein was visualized with ultrasound and found to widely patent. It was then accessed under direct ultrasound guidance without difficulty with [name] Seldinger needle. [name] J-wire was placed. After skin nick and dilatation, the 30 cm long Trialysis catheter was placed over the wire, and the wire was removed. All 3 lumens withdrew blood well and flushed easily with sterile saline. It was secured at 30 cm at the skin with 3 nylon sutures. Sterile dressing was placed. The patient tolerated the procedure well and remained in the Critical Care Unit in critical condition.  ____________________________ Annice NeedyJason S. Dew, MD jsd:OSi D: 12/15/2012 17:29:08 ET T: June 23, 2013 06:10:32 ET JOB#: [id number]  cc: Annice NeedyJason S. Dew, MD, <Dictator> Annice NeedyJASON S DEW MD ELECTRONICALLY SIGNED 12/18/2012 13:56

## 2014-11-22 NOTE — H&P (Signed)
PATIENT NAME:  Katelyn [name], Katelyn [name] MR#:  [id number] DATE OF BIRTH:  05/12/1951  DATE OF ADMISSION:  10/16/2012  REFERRING PHYSICIAN:  Charlesetta Ivory, MD  PRIMARY CARE PHYSICIAN: Denton Lank, MD - [place]   GASTROENTEROLOGIST:  Dr. Dory Horn [facility]  CHIEF COMPLAINT: Unresponsiveness.   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 The patient was brought by her family for unresponsiveness. Daughter at bedside and she gives most of the history. She reports her mother is known to have history of liver disease due to sarcoidosis, sarcoid liver and hepatitis C, where she has been followed at [facility], where she had evidence of liver mass on MRI of the liver done in September 2013, where with a followup she had progression of the liver mass, where she was recommended to have laparoscopic ultrasound-guided microwave ablation of the mass, where she was admitted for 1 day, on March 11th, for this.  Family reports since discharge they instructed by the physician not to take her lactulose so she has been off lactulose, and they report yesterday she had an episode of decreased p.o. intake and lethargy where she had hypoglycemia where they called EMS, where they found her blood sugar to be 20, where she was given p.o. concentrated form of sugar where her fingersticks improved after that, where they report she was symptomatic during the episode, but reported during today they noticed her to be lethargic and unresponsive which prompted them to bring her to the ED.  Report at  baseline she is communicative and ambulates with a walker or with a cane. In the ED, the patient had low grade temperature of 99.9. Her blood pressure has been stable. She was found to have elevated ammonia  level at 202. As well she had elevated LFTs, ALT of 119, AST of 209, alk phos of 262. Unfortunately, the level is unknown currently. So medical records were requested.  The patient as well as elevated lactic acid of 5.6 and elevated white blood cell count, but urinalysis was negative. Chest x-ray did not show any infiltrate. As mentioned earlier, the patient did not take her lactulose for 1 week, as per her physician instruction. She was unresponsive in the ER where she received 1 dose of lactulose per rectum. The patient had CT of brain which did not show any acute finding.    PAST MEDICAL HISTORY: 1.  Diabetes mellitus.  2.  Hypertension.  3.  Hepatic sarcoidosis.  4.  Hepatitis C.  5.  Diagnosis of hepatic mass, was never biopsied, but family reports it is malignant from blood work, status post recent laparoscopic ultrasound-guided microwave ablation on March 11th.  PAST SURGICAL HISTORY:  1.  As mentioned above.  2.  BTL.   REVIEW OF SYSTEMS:  The patient is obtunded, cannot provide.  FAMILY HISTORY: Negative for diabetes and hypertension. There is a history of breast cancer in the family.  SOCIAL HISTORY: No history of smoking. No history of drinking. No history of drug use. Lives with the family.   HOME MEDICATIONS:  1.  Aspirin 81 mg oral daily.  2.  Meloxicam 15 mg oral daily. 3.  Oxycodone 5 mg oral 4 times a day as needed.  4.  Gabapentin 100 mg oral 3 times daily.  5.  Sertraline  100 mg oral daily.  6.  Glipizide XL 10 mg oral daily.  7.  Humalog 16 units before large meal, 10 units before small meals. 8.  Levemir 50 units sub-Q b.i.d.  9.  Metformin extended release 1000 mg oral 2 times a day.  10.  Hydrochlorothiazide/losartan 25/100 mg oral daily.  11.  Hydroxyzine 25 mg oral at bedtime.  12.  Amlodipine 2.5 mg oral daily.  13.  Lasix 20 mg oral daily.  14.  Multivitamin with iron 1 tablet oral daily.  15.  Potassium gluconate 595 oral daily.  16.  CoQ10 100 mg oral  daily.  17.  Atenolol 100 mg oral daily.   PHYSICAL EXAMINATION: VITAL SIGNS: Temperature 99.9, pulse 98, respiratory rate 25, blood pressure 137/68, saturating 97% on room air.  GENERAL: Obese female, unresponsive, looks comfortable, in no apparent distress.  HEENT: Head atraumatic, normocephalic. Pupils equally reactive to light. Pink conjunctivae. Moist oral mucosa. Has alopecia.  NECK: Supple. No thyromegaly. No JVD.  CHEST: Good air entry bilaterally. No wheezing, rales or rhonchi.  CARDIOVASCULAR: S1, S2 heard. No rubs, murmurs or gallops.  ABDOMEN: Has bruise/ecchymosis in the right upper quadrant area from the previous surgery. Site of laparoscopic incisions look clean with no drainage. Could not elicit any tenderness on palpation. Bowel sounds present.  EXTREMITIES: No edema. No clubbing. No cyanosis.  PSYCHIATRIC: The patient is obtunded.  Grimaces to sternal rub. NEUROLOGIC: Unable to evaluate due to her mental status, but appears to be moving all extremities without any focal deficits to pain.  SKIN: Normal skin turgor. Warm and dry.   PERTINENT LABS AND DIAGNOSTICS: Glucose 119, BUN 62, creatinine 1.43, sodium 136, potassium 3.5, chloride 104, CO2 19, ammonia 202, total protein 8.9, albumin 2.4, total bilirubin 1.5, alk phos 262, AST 202, ALT 119. Troponin less than 0.02. CK-MB 48.3, total CK 1528. TSH 1.04. Urine drugs negative. White blood cell 15.4, hemoglobin 10.3, hematocrit 31.3, platelets 168. Urinalysis negative. Salicylate and acetaminophen within normal limits.  ABG showing pH 7.53, pCO2 25, pO2 99 and lactic acid 5.7.   CT of brain:  No acute findings.   EKG is showing normal sinus rhythm but tachycardic without significant ST or T wave changes.   ASSESSMENT AND PLAN: 1.  Encephalopathy. 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 She was not taking her lactulose for the last week. We will resume the patient's lactulose via NG tube and will check  ammonia level regularly. Other etiologies cannot be ruled out.  The patient is on multiple medications which can cause altered mental status. Will hold her pain meds. We will hold her trazodone. As well we will hold her gabapentin, and we will hold her hydroxyzine. As well sepsis might be a source of these symptoms, especially with elevated lactic acid with leukocytosis and low grade temperature, but so far does not have any infiltrates noted on the chest x-ray. Will need to rule out any abdominal source for infection, especially with recent procedure done. We will obtain CT of abdomen and pelvis without contrast giving her poor renal function. As well the patient is status post microwave ablation. Requesting records from [facility]. The patient is to continue to follow with [facility] after discharge.  2.  Diabetes mellitus.  Given the patient had episodes of hypoglycemia yesterday, we will hold all of her meds, especially she is n.p.o., and will monitor her fingerstick blood glucose and once mentation improves and she can tolerate p.o. intake we will resume  her on diet and we will add her on insulin sliding scale.  3.  Hypertension. We will resume atenolol and will monitor closely. If still elevated, we will resume her back on Losartan and hydrochlorothiazide.  4.  Elevated liver function tests.  This is most likely due to her liver disease as well as due to her recent microwave ablation. But anyhow obtaining her labs from [facility].  As well the patient is obtaining CT of abdomen.  5.  Deep vein thrombosis prophylaxis. Sequential compression device.  6.  Leukocytosis with low-grade temperature and elevated lactic acid. The patient's chest x-ray does not show any opacity, urinalysis is negative and awaiting CT of abdomen to rule out any source of infection.   CODE STATUS:  Daughter and sister at bedside and they confirm FULL CODE.   TOTAL TIME SPENT ON ADMISSION AND PATIENT CARE:  60  minutes. ____________________________ Albertine Patricia, MD dse:sb D: 10/16/2012 03:26:42 ET T: 10/16/2012 07:39:15 ET JOB#: [id number]  cc: Albertine Patricia, MD, <Dictator> Roemello Speyer Graciela Husbands MD ELECTRONICALLY SIGNED 10/17/2012 2:21

## 2014-11-22 NOTE — Consult Note (Signed)
CHIEF COMPLAINT and HISTORY:  Subjective/Chief Complaint renal failure, CHF   History of Present Illness 64 [name] who has been in hospital for several days.  Worsening SOB/CHF.  Now intubated Worsening renal failure.  Needs catheter for HD   PAST MEDICAL/SURGICAL HISTORY:  Past Medical History:   alopecia:    DVT - Deep Vein Thrombosis:    Sarcoid Liver Involvement:    Hypothyroidism:    Hepatitis C:    neuropathy:    Hypercholesterolemia:    HTN:    Diabetic:   ALLERGIES:  Allergies:  Contrast dye: Unknown  HOME MEDICATIONS:  Home Medications: Medication Instructions Status  oxycodone 5 mg oral tablet 1 tab(s) orally 4 times a day, As Needed- for Pain  Active  Mylanta 400 mg-400 mg-40 mg/5 mL oral suspension 30 milliliter(s) orally 3 times a day as needed for indigestion, heartburn. Active  insulin detemir 50 unit(s) subcutaneous 2 times a day Active  loperamide 2 mg oral capsule 1 cap orally 4 times a day (after meals and at bedtime) as needed for diarrhea. Active  Zoloft 50 mg oral tablet 1 tab(s) orally once a day Active  aspirin 81 mg oral tablet, chewable 1 tab(s) orally once a day Active  atenolol 100 mg oral tablet 1 tab(s) orally once a day. *<=110: hold if sbp less than 110* Active  carbidopa-levodopa 25 mg-100 mg oral tablet 1 tab(s) orally 2 times a day Active  gabapentin 800 mg oral tablet 1 tab(s) orally 3 times a day. Active  Glucotrol 5 mg oral tablet 2 tabs (39m) orally once a day. Active  KCl-20 1 tab(s) orally once a day (in the morning) Active  lactulose 10 g/15 mL oral syrup 30 milliliter(s) orally 2 times a day. Active  furosemide 40 mg oral tablet 1 tab(s) orally once a day (in the morning) Active  losartan 50 mg oral tablet 1 tab(s) orally once a day (at bedtime) Active  multivitamin 1 tab(s) orally once a day Active  insulin regular human recombinant 100 units/mL injectable solution use as directed subcutaneous 4 times a day (before meals and  at bedtime). BS 0-200=0 units, 201-250=3 units, 251-300=5 units, 301-350=7 units, 351-400=10 units, >400=Call MD Active  promethazine 25 mg oral tablet 1 tab(s) orally every 6 hours as needed for nausea, vomiting. Active  Tylenol 325 mg oral tablet 2 tabs (6578m orally every 4 hours as needed for pain. Active   Family and Social History:  Family History Non-Contributory   Social History negative ETOH, negative Illicit drugs   Place of Living Home   Review of Systems:  ROS Pt not able to provide ROS   Medications/Allergies Reviewed Medications/Allergies reviewed   Physical Exam:  GEN obese, critically ill appearing   HEENT pink conjunctivae, moist oral mucosa   NECK No masses  trachea midline   RESP rhonchi  crackles  intubated   CARD regular rate  no JVD   ABD distended  hypoactive BS   GU foley catheter in place   LYMPH negative neck, negative axillae   EXTR positive edema   SKIN skin turgor good   NEURO unable to assess, sedated   PSYCH sedated   LABS:  Laboratory Results: BF Analysis:    12-May-14 15:50, Albumin, Body Fluid  Albumin, Body Fluid < 0.6  Result(s) reported on 11 Dec 2012 at 05:13PM.    12-May-14 15:50, Amylase, Body Fluid  Amylase (BF) 12  Result(s) reported on 11 Dec 2012 at 05:13PM.    12-May-14 15:50,  Body Fluid Nucleated Cell Count, Diff  Color - (BF)   LIGHT YELLOW  Clarity (BF) CLOUDY  NCC  (nucleated cell count) 9183  Neutrophils  (BF) 84  Lymphocytes  (BF) 0  Monocytes/Macrophages  (BF) 16  Eosinophil  (BF) 0  Basophil  (BF) 0  Other Cells  (BF) 0    12-May-14 15:50, Glucose, Body Fluid  Glucose, BF 106  Result(s) reported on 11 Dec 2012 at 05:13PM.    12-May-14 15:50, LDH, Body Fluid  LDH, BF 102  Result(s) reported on 11 Dec 2012 at 05:13PM.    12-May-14 15:50, Protein, Body Fluid  Protein, BF 0.7  Body Fluid Source   ABDOMINAL FLUID   Result(s) reported on 11 Dec 2012 at 05:13PM.    12-May-14 15:50, Specific  Gravity, Body Fluid  Specific Gravity (BF) 1.010  Body Fluid Source (CC)   ABDOMINAL FLUID   Result(s) reported on 11 Dec 2012 at 05:39PM.  Thyroid:    11-May-14 13:37, Thyroid Stimulating Hormone  Thyroid Stimulating Hormone 1.34  0.45-4.50  (International Unit)   -----------------------  Pregnant patients have   different reference   ranges for TSH:   - - - - - - - - - -   Pregnant, first trimetser:   0.36 - 2.50 uIU/mL  Hepatic:    11-May-14 13:37, Comprehensive Metabolic Panel  Bilirubin, Total 1.0  Alkaline Phosphatase 264  SGPT (ALT) 15  SGOT (AST) 66  Total Protein, Serum 8.7  Albumin, Serum 1.7    12-May-14 04:38, Comprehensive Metabolic Panel  Bilirubin, Total 1.3  Alkaline Phosphatase 193  SGPT (ALT) 11  SGOT (AST) 42  Total Protein, Serum 7.1  Albumin, Serum 1.4  LabUnknown:    12-May-14 15:50, Body Fluid Nucleated Cell Count, Diff  Result Interpretation   Smear of abdominal fluid is reviewed and shows 84% neutrophils.  Negative for malignancy. T.Rubinas MD.  Routine BB:    12-May-14 11:02, Crossmatch 1 Unit  Crossmatch Unit 1   Transfused   Result(s) reported on 13 Dec 2012 at [facility].    12-May-14 11:02, Fresh Frozen Plasma - 2 Units  Fresh Frozen Plasma Unit 1   Transfused  Fresh Frozen Plasma Unit 2   Transfused   Result(s) reported on 12 Dec 2012 at 07:37AM.    12-May-14 11:02, Type and Antibody Screen  ABO Group + Rh Type   O Positive  Antibody Screen NEGATIVE  Result(s) reported on 11 Dec 2012 at 12:26PM.  Routine Micro:    12-May-14 15:50, Body Fluid Culture w/Smear  Micro Text Report   BODY FLUID CULTURE    COMMENT                   NO GROWTH AEROBICALLY/ANAEROBICALLY IN 4 DAYS    GRAM STAIN                MANY WHITE BLOOD CELLS    GRAM STAIN                NO ORGANISMS SEEN     ANTIBIOTIC  Specimen Source ASCITES  Culture Comment   NO GROWTH AEROBICALLY/ANAEROBICALLY IN 4 DAYS  Gram Stain 1   MANY WHITE BLOOD CELLS  Gram Stain 2    NO ORGANISMS SEEN   Result(s) reported on 15 Dec 2012 at 08:21AM.    13-May-14 16:12, Clostridium Diff Toxin by RT-PCR  Micro Text Report   CLOS.DIFF ASSAY, RT-PCR    COMMENT  NEGATIVE-CLOS.DIFFICILE TOXIN NOT DETECTED BY PCR     ANTIBIOTIC  Comment  1.   NEGATIVE-CLOS.DIFFICILE TOXIN NOT DETECTED BY PCR ----------------------------------  Test procedure integrates sample purification, nucleic acid  amplification, and detection of the target Clostridium difficile  sequence in simple or complex samplesusing real-time PCR and  RT-PCR assays.  Lab:    16-May-14 14:50, ABG  pH (ABG) 7.42  PCO2 28  PO2 48  FiO2 30  Base Excess -4.9  HCO3 18.2  O2 Saturation 87.8  O2 Device CANNULA  Specimen Site (ABG)   LT RADIAL  Specimen Type (ABG) ARTERIAL  Patient Temp (ABG) 37.0    16-May-14 16:15, ABG  pH (ABG) 7.29  PCO2 42  PO2 63  FiO2 70  Base Excess -6.1  HCO3 20.2  O2 Saturation 93.7  O2 Device BIPAP  Specimen Site (ABG)   RT RADIAL  Specimen Type (ABG) ARTERIAL  Patient Temp (ABG) 37.0  PSV 10  PEEP 5.0  %FiO2 70.0  Mechanical Rate 8  Result(s) reported on 15 Dec 2012 at 04:39PM.  General Ref:    12-May-14 15:50, Abdominal Fluid (Cytology)  Abdominal Fluid (Cytology)   ========== TEST NAME ==========  ========= RESULTS =========  = REFERENCE RANGE =    ABDOMINAL FLUID (CYTO)    No. of containers.Marland Kitchen01 CYTYC Thin Prep Vial  Abdominal Fluid Cytology  Source:                         [   Final Report         ]        ABDOMINAL FLUID  Clinician provided ICD9:        [   Final Report         ]                    [place]  DIAGNOSIS:                      [   Final Report         ]                    ABDOMINAL FLUID  NEGATIVE FOR MALIGNANT CELLS.  REACTIVE MESOTHELIAL CELLS ARE PRESENT.  ACUTE INFLAMMATION IS PRESENT.  THIS INTERPRETATION INCLUDES EVALUATION OF CELL BLOCKS.  Signed out by:                  [   Final Report         ]                    Galvin Proffer, MD, Pathologist  Performed by:   [   Final Report         ]                    Rosanna Randy, Cytotechnologist (ASCP)  Gross description:              [   Final Report         ]                    2000ML, YELLOW, CLOUDY  /LCS                  [facility]            No: [id number]  [address], [place], [place] [address]            Lindon Romp, [place]     Result(s) reported on 13 Dec 2012 at 04:14PM.    12-May-14 15:50, Miscellaneous Fluid (Cytology)  Miscellaneous Fluid (Cytology)   ========== TEST NAME ==========  ========= RESULTS =========  = REFERENCE RANGE =    MISC. FLUID (CYTO)    12-May-14 15:50, pH, Body Fluid  pH, Body Fluid   ========== TEST NAME ==========  ========= RESULTS =========  = REFERENCE RANGE =    PH, BODY FLUID    pH, Body Fluid  pH, Body Fluid                  [   7.0                  ]        Not Estab.                  [facility]            No: [id number]            [address] [place], [place] [address]            Lindon Romp, MD         [phone]     Result(s) reported on 12 Dec 2012 at 12:54PM.    12-May-14 17:23, ANA w/Reflex to 28 Conf.Tests  ANA w/Reflex to 9 Conf.Tests   ========== TEST NAME ==========  ========= RESULTS =========  = REFERENCE RANGE =    ANA W/REFLEX-9 CONF.TEST    ANA w/Reflex if Positive  ANA Direct                      [   Negative             ]          Negative                  [facility]            No: [id number]            [address] [place], [place] [address]            Lindon Romp, MD         [phone]     Result(s) reported on 12 Dec 2012 at 04:05PM.    12-May-14 17:23, ANCA Panel  ANCA Panel   ========== TEST NAME ==========  ========= RESULTS =========  = REFERENCE RANGE =    ANCA PANEL    ANCA Panel  Antimyeloperoxidase (MPO) Abs   [   <9.0 U/mL            ]           0.0-9.0  Antiproteinase 3  (PR-3) Abs     [   <3.5 U/mL            ]       0.0-3.5  Cytoplasmic (C-ANCA)            [   <1:20 titer          ]         Neg:<1:20  Perinuclear (P-ANCA)            [   <1:20 titer          ]  Neg:<1:20  The presence of positive fluorescence exhibiting P-ANCA or C-ANCA  patterns alone is not specific for the diagnosis of Wegener's  Granulomatosis (WG) or microscopic polyangiitis. Decisions about  treatment should not be based solely on ANCA IFA results.  The  International ANCA Group Consensus recommends follow up testing of  positive sera with both PR-3 and MPO-ANCA enzyme immunoassays. As  many as 5% serum samples are positive only by EIA.  Ref. AM J Clin Pathol 1999;111:507-513.  Atypical pANCA                  [   <1:20 titer          ]         Neg:<1:20  The atypical pANCA pattern has been observed in a significant  percentage of patients with ulcerative colitis, primary sclerosing  cholangitis and autoimmune hepatitis.                  [facility]            No: [id number]            [address] [place], [place] [address]            Lindon Romp, MD         [phone]     Result(s) reported on 13 Dec 2012 at 02:54PM.    12-May-14 17:23, Antiglomerular Base Membrane  Antiglomerular Base Membrane   ========== TEST NAME ==========  ========= RESULTS =========  = REFERENCE RANGE =    ANTIGLOMERULAR BASE MEM.    Antiglomerular BM Ab  Antiglomerular BM Ab            [   4 units              ]              0-20                                    Negative                   0 - 20                                    Weak Positive             21 - 30                                    Moderate to Strong Positive   >30                  [facility]            No: [id number]            [address], [place], [place] [address]            Lindon Romp, MD         [phone]     Result(s) reported on 13 Dec 2012 at 02:54PM.    12-May-14  17:23, Complement C3, Serum  Complement C3, Serum   ========== TEST NAME ==========  ========= RESULTS =========  = REFERENCE RANGE =    COMPLEMENT C3, SERUM    Complement C3, Serum  Complement C3, Serum            [L  82 mg/dL Adult       ]            90-180                  [facility]    No: [id number]            [address] [place], [place] [address]            Lindon Romp, MD         [phone]     Result(s) reported on 13 Dec 2012 at 02:54PM.    12-May-14 17:23, Complement C4  Complement C4   ========== TEST NAME ==========  ========= RESULTS =========  = REFERENCE RANGE =    COMPLEMENT C4    Complement C4, Serum  Complement C4, Serum            [   17 mg/dL Adult       ]              [address]            No: [id number]            [address], [place], [place] [address]            Lindon Romp, MD         [phone]     Result(s) reported on 13 Dec 2012 at 02:54PM.    12-May-14 17:23, Parathyroid Hormone, Intact  Parathyroid Hormone, Intact   ========== TEST NAME ==========  ========= RESULTS =========  = REFERENCE RANGE =    PTH INTACT    PTH, Intact  PTH, Intact                     [   34 pg/mL             ]             [address]                  [facility]            No: [id number]            [address] [place], [place] [address]            Lindon Romp, MD         [phone]     Result(s) reported on 12 Dec 2012 at 09:48AM.    12-May-14 17:23, Protein Electrophoresis, Serum  Protein Electrophoresis, Serum   ========== TEST NAME ==========  ========= RESULTS =========  = REFERENCE RANGE =    PROTEIN ELECTROPHORESIS    Protein Electro.,S  Protein, Total, Serum           [   6.8 g/dL             ]           6.0-8.5  Albumin                         [L  2.2 g/dL             ]           3.2-5.6  Alpha-1-Globulin                [  0.2 g/dL             ]            0.1-0.4  Alpha-2-Globulin                [   0.5 g/dL             ]           0.4-1.2  Beta Globulin                   [   1.0 g/dL             ]           0.6-1.3  Gamma Globulin                  [H  3.0 g/dL             ]           0.5-1.6  M-Spike                         [   Not Observed g/dL    ]      Not Observed  Globulin, Total                 [H  4.6 g/dL             ]           2.0-4.5  A/G Ratio                       [L  0.5                  ]           0.7-2.0  Please note:                    [   Final Report         ]                    Protein electrophoresis scan will follow via computer, mail, or  courier delivery.                [facility]            No: [id number]            [address] [place], [place] [address]            Lindon Romp, MD         [phone]     Result(s) reported on 13 Dec 2012 at 02:54PM.    12-May-14 18:00, Eosinophil, Urine  Eosinophil, Urine   ========== TEST NAME ==========  ========= RESULTS =========  = REFERENCE RANGE =    EOSINOPHIL, URINE    Eosinophil, Urine  Eosinophil, Urine               [   No Eosinophils Seen %]                     <5% few or none seen                  [facility]            No: [id number]            [address], [place], [place]  [address]            William F Hancock, MD         [phone]     Result(s) reported on 12 Dec 2012 at 05:18PM.    12-May-14 18:00, Protein Electrophoresis, Urine Random  Protein Electrophoresis, Urine Random   ========== TEST NAME ==========  ========= RESULTS =========  = REFERENCE RANGE =    UR PROT ELECTROPH-RANDOM    Protein Electro, Random Urine  Protein,Total,Urine             [H  61.3 mg/dL           ]          0.0-15.0  Albumin, U   [   26.8 %               ]                    Alpha-1-Globulin, U             [   2.4 %                ]                    Alpha-2-Globulin, U             [   9.5 %                ]                     Beta Globulin, U                [   19.4 %           ]                    Gamma Globulin, U               [   41.9 %               ]                    M-Spike, %                      [   Not Observed %       ]      Not Observed  Please note:                    [   Final Report         ]               Protein electrophoresis scan will follow via computer, mail, or  courier delivery.                  [facility]            No: [id number]            [address] [place], [place] [address]            Lindon Romp, MD     [phone]     Result(s) reported on 12 Dec 2012 at 05:18PM.  Cardiology:    11-May-14 13:49, ED ECG  Ventricular Rate 98  Atrial Rate 98  P-R Interval 122  QRS Duration 78  QT 354  QTc 451  P Axis  20  R Axis -7  T Axis 18  ECG interpretation   Sinus rhythm with occasional Premature ventricular complexes  Cannot rule out Anterior infarct , age undetermined  Abnormal ECG  When compared with ECG of 16-Oct-2012 00:35,  Premature ventricular complexes are now Present  T wave inversion no longerevident in Anterior leads  ----------unconfirmed----------  Confirmed by OVERREAD, NOT (100), editor PEARSON, BARBARA ([id number]) on 12/11/2012 1:41:52 PM  ED ECG   Routine Chem:    11-May-14 13:37, B-Type Natriuretic Peptide ([facility])  B-Type Natriuretic Peptide [facility]) 192  Result(s) reported on 10 Dec 2012 at 02:07PM.    11-May-14 13:37, Comprehensive Metabolic Panel  Glucose, Serum 32  BUN 26  Creatinine (comp) 2.29  Sodium, Serum 136  Potassium, Serum 5.0  Chloride, Serum 112  CO2, Serum 18  Calcium (Total), Serum 7.9  Osmolality (calc) 273  eGFR (African American) 26  eGFR (Non-African American) 22  eGFR values <45m/min/1.Katelyn m2 may be an indication of chronic  kidney disease (CKD).  Calculated eGFR is useful in patients with stable renal function.  The eGFR calculation will not be reliable in acutely ill patients  when serum creatinine is  changing rapidly. It is not useful in   patients on dialysis. The eGFR calculation may not be applicable  to patients at the low and high extremes of body sizes, pregnant  women, and vegetarians.  Result Comment   GLUCOSE - RESULTS VERIFIED BY REPEAT TESTING.   - NOTIFIED OF CRITICAL VALUE   - HP TO SAneta@ 1411,12/10/12   - READ-BACK PROCESS PERFORMED.  POTASSIUM/AST - Slight hemolysis, interpret results with   - caution.   Result(s) reported on 11 May2014 at 01:53PM.  Anion Gap 6    11-May-14 13:37, Lipase  Lipase 759  Result(s) reported on 10 Dec 2012 at 02:12PM.    12-May-14 04:38, Comprehensive Metabolic Panel  Glucose, Serum 95  BUN 28  Creatinine (comp) 2.49  Sodium, Serum 136  Potassium, Serum 5.2  Chloride, Serum 111  CO2, Serum 16  Calcium (Total), Serum 7.9  Osmolality (calc) 277  eGFR (African American) 24  eGFR (Non-African American) 20  eGFR values <663mmin/1.Katelyn m2 may be an indication of chronic  kidney disease (CKD).  Calculated eGFR is useful in patients with stable renal function.  The eGFR calculation will not be reliable in acutely ill patients  when serum creatinine is changing rapidly. It is not useful in   patients on dialysis. The eGFR calculation may not be applicable  to patients at the low and high extremes of body sizes, pregnant  women, and vegetarians.  Anion Gap 9    12-May-14 04:38, Hemoglobin A1c ([facility])  Hemoglobin A1c ([facility]) 5.9  The American Diabetes Association recommends that a primary goal of  therapy should be <7% and that physicians should reevaluate the  treatment regimen in patients with HbA1c values consistently >8%.    12-May-14 04:38, Magnesium, Serum  Magnesium, Serum 1.1  1.8-2.4  THERAPEUTIC RANGE: 4-7 mg/dL  TOXIC: > 10 mg/dL   -----------------------    12-May-14 15:50, Body Fluid Nucleated Cell Count, Diff  Result Comment   BODY FLUID/ABDOMINAL - PATHOLOGIST TO REVIEW SMEAR. COMMENTS   - APPEAR ON REPORT  WHEN COMPLETE.   Result(s) reported on 11 Dec 2012 at 05:39PM.    12-May-14 15:50, Miscellaneous Fluid (Cytology)  Result Comment   MISC FLUID CYTOLOGY - CANCEL - SEE [id number] ABDOMINAL   - FLUID CYTOLOGY REPORT   Result(s) reported on  13 Dec 2012 at 04:15PM.    12-May-14 17:23, Phosphorus, Serum  Phosphorus, Serum 3.0  Result(s) reported on 11 Dec 2012 at 06:04PM.    13-May-14 47:42, Basic Metabolic Panel (w/Total Calcium)  Glucose, Serum 185  BUN 30  Creatinine (comp) 2.56  Sodium, Serum 135  Potassium, Serum 4.9  Chloride, Serum 109  CO2, Serum 19  Calcium (Total), Serum 7.9  Anion Gap 7  Osmolality (calc) 281  eGFR (African American) 23  eGFR (Non-African American) 20  eGFR values <56m/min/1.Katelyn m2 may be an indication of chronic  kidney disease (CKD).  Calculated eGFR is useful in patients with stable renal function.  The eGFR calculation will not be reliable in acutely ill patients  when serum creatinine is changing rapidly. It is not useful in   patients on dialysis. The eGFR calculation may not be applicable  to patients at the low and high extremes of body sizes, pregnant  women, and vegetarians.    14-May-14 059:56 Basic Metabolic Panel (w/Total Calcium)  Glucose, Serum 106  BUN 29  Creatinine (comp) 2.25  Sodium, Serum 136  Potassium, Serum 4.2  Chloride, Serum 109  CO2, Serum 21  Calcium (Total), Serum 8.1  Anion Gap 6  Osmolality (calc) 278  eGFR (African American) 27  eGFR (Non-African American) 23  eGFR values <668mmin/1.Katelyn m2 may be an indication of chronic  kidney disease (CKD).  Calculated eGFR is useful in patients with stable renal function.  The eGFR calculation will not be reliable in acutely ill patients  when serum creatinine is changing rapidly. It is not useful in   patients on dialysis. The eGFR calculation may not be applicable  to patients at the low and high extremes of body sizes, pregnant  women, and vegetarians.    15-May-14  0538:75Basic Metabolic Panel (w/Total Calcium)  Glucose, Serum 117  BUN 27  Creatinine (comp) 2.17  Sodium, Serum 139  Potassium, Serum 4.5  Chloride, Serum 111  CO2, Serum 19  Calcium (Total), Serum 8.3  Anion Gap 9  Osmolality (calc) 284  eGFR (African American) 28  eGFR (Non-African American) 24  eGFR values <6081min/1.Katelyn m2 may be an indication of chronic  kidney disease (CKD).  Calculated eGFR is useful in patients with stable renal function.  The eGFR calculation will not be reliable in acutely ill patients  when serum creatinine is changing rapidly. It is not useful in   patients on dialysis. The eGFR calculation may not be applicable  to patients at the low and high extremes of body sizes, pregnant  women, and vegetarians.    16-May-14 04:64:33asic Metabolic Panel (w/Total Calcium)  Glucose, Serum 29  BUN 29  Creatinine (comp) 2.28  Sodium, Serum 138  Potassium, Serum 3.8  Chloride, Serum 108  CO2, Serum 21  Calcium (Total), Serum 8.4  Anion Gap 9  eGFR (African American) 26  eGFR (Non-African American) 23  eGFR values <69m41mn/1.Katelyn m2 may be an indication of chronic  kidney disease (CKD).  Calculated eGFR is useful in patients with stable renal function.  The eGFR calculation will not be reliable in acutely ill patients  when serum creatinine is changing rapidly. It is not useful in   patients on dialysis. The eGFR calculation may not be applicable  to patients at the low and high extremes of body sizes, pregnant  women, and vegetarians.  Result Comment   GLUCOSE - RESULTS VERIFIED BY REPEAT TESTING.   - NOTIFIED OF CRITICAL VALUE   -  C/ JEFF LUSH _0  12-15-12.Marland KitchenAJO   - READ-BACK PROCESS PERFORMED.   Result(s) reported on 15 Dec 2012 at 05:22AM.    16-May-14 14:50, ABG  Result Comment   - HAND DELIVERED   - dr Verdell Carmine at 1455, 12/15/2012   Result(s) reported on 15 Dec 2012 at 02:55PM.  Misc Urine Chem:    12-May-14 06:42, Sodium, Urine Random  Sodium,  Urine Random 18  Result(s) reported on 11 Dec 2012 at 07:11AM.    12-May-14 18:00, Protein/Creatinine Ratio, Ur Random  Creatinine, Urine 222.7  Protein, Random Urine 69  Protein/Creat Ratio (comp) 310  Result(s) reported on 11 Dec 2012 at 07:07PM.    13-May-14 06:53, Creatinine, 24 hr Urine  Total Volume, Urine 400  Collection Hours, Urine 24  Creatinine, Urine 178.3  Creatinine, 24 hr Urine 713  Result(s) reported on 12 Dec 2012 at 07:41AM.  Cardiac:    11-May-14 13:37, Troponin I  Troponin I < 0.02  0.00-0.05  0.05 ng/mL or less: NEGATIVE   Repeat testing in 3-6 hrs   if clinically indicated.  >0.05 ng/mL: POTENTIAL   MYOCARDIAL INJURY. Repeat   testing in 3-6 hrs if   clinically indicated.  NOTE: An increase or decrease   of 30% or more on serial   testing suggests a   clinically important change  Routine UA:    12-May-14 06:42, Urinalysis  Color (UA) Yellow  Clarity (UA) Hazy  Glucose (UA) Negative  Bilirubin (UA) Negative  Ketones (UA) Trace  Specific Gravity (UA) 1.014  Blood (UA) Negative  pH (UA) 5.0  Protein (UA) Negative  Nitrite (UA) Negative  Leukocyte Esterase (UA) Trace  Result(s) reported on 11 Dec 2012 at 07:11AM.  RBC (UA) 2 /HPF  WBC (UA) 12 /HPF  Bacteria (UA) TRACE  Epithelial Cells (UA) <1 /HPF  Mucous (UA) PRESENT  Hyaline Cast (UA) 21 /LPF  Calcium Oxalate Crystal (UA) PRESENT  Result(s) reported on 11 Dec 2012 at 07:11AM.  Routine Sero:    13-May-14 12:43, Occult Blood, Feces  Occult Blood, Feces NEGATIVE  Result(s) reported on 12 Dec 2012 at 02:04PM.  Routine Coag:    11-May-14 14:28, Prothrombin Time  Prothrombin 17.4  INR 1.4  INR reference interval applies to patients on anticoagulant therapy.  A single INR therapeutic range for coumarins is not optimal for all  indications; however, the suggested range for most indications is  2.0 - 3.0.  Exceptions to the INR Reference Range may include: Prosthetic heart  valves, acute  myocardial infarction, prevention of myocardial  infarction, and combinations of aspirin and anticoagulant. The need  for a higher or lower target INR must be assessed individually.  Reference: The Pharmacology and Management of the Vitamin K   antagonists: the seventh ACCP Conference on Antithrombotic and  Thrombolytic Therapy. [id number] Sept:126 (3suppl): [id number].  A HCT value >55% may artifactually increase the PT.  In one study,   the increase was an average of 25%.  Reference:  "Effect on Routine and Special Coagulation Testing Values  of Citrate Anticoagulant Adjustment in Patients with High HCT Values."  American Journal of Clinical Pathology 2006;126:400-405.    12-May-14 04:38, Activated PTT  Activated PTT (APTT) 44.8  A HCT value >55% may artifactually increase the APTT. In one study,  the increase was an average of 19%.  Reference: "Effect on Routine and Special Coagulation Testing Values  of Citrate Anticoagulant Adjustment in Patients with High HCT Values."  American Journal of Clinical Pathology 2006;126:400-405.  12-May-14 04:38, Prothrombin Time  Prothrombin 19.5  INR 1.7  INR reference interval applies to patients on anticoagulant therapy.  A single INR therapeutic range for coumarins is not optimal for all  indications; however, the suggested range for most indications is  2.0 - 3.0.  Exceptions to the INR Reference Range may include: Prosthetic heart  valves, acute myocardial infarction, prevention of myocardial  infarction, and combinations of aspirin and anticoagulant. The need  for a higher or lower target INR must be assessed individually.  Reference: The Pharmacology and Management of the Vitamin K   antagonists: the seventh ACCP Conference on Antithrombotic and  Thrombolytic Therapy. [id number] Sept:126 (3suppl): [id number].  A HCT value >55% may artifactually increase the PT.  In one study,   the increase was an average of 25%.  Reference:  "Effect on  Routine and Special Coagulation Testing Values  of Citrate Anticoagulant Adjustment in Patients with High HCT Values."  American Journal of Clinical Pathology 2006;126:400-405.  Routine Hem:    11-May-14 13:37, CBC Profile  WBC (CBC) 13.2  RBC (CBC) 2.86  Hemoglobin (CBC) 8.7  Hematocrit (CBC) 26.6  Platelet Count (CBC) 161  MCV 93  MCH 30.5  MCHC 32.8  RDW 14.1  Neutrophil % 79.0  Lymphocyte % 11.4  Monocyte % 8.6  Eosinophil % 0.5  Basophil % 0.5  Neutrophil # 10.4  Lymphocyte # 1.5  Monocyte # 1.1  Eosinophil # 0.1  Basophil # 0.1  Result(s) reported on 10 Dec 2012 at 01:53PM.    12-May-14 04:38, CBC Profile  WBC (CBC) 14.6  RBC (CBC) 2.50  Hemoglobin (CBC) 7.8  Hematocrit (CBC) 23.2  Platelet Count (CBC) 153  MCV 93  MCH 31.3  MCHC 33.7  RDW 13.8  Neutrophil % 62.7  Lymphocyte % 21.8  Monocyte % 14.1  Eosinophil % 1.0  Basophil % 0.4  Neutrophil # 9.1  Lymphocyte # 3.2  Monocyte # 2.0  Eosinophil # 0.1  Basophil # 0.1  Result(s) reported on 11 Dec 2012 at 05:15AM.    13-May-14 04:44, CBC Profile  WBC (CBC) 10.5  RBC (CBC) 2.15  Hemoglobin (CBC) 6.7  Hematocrit (CBC) 19.8  Platelet Count (CBC) 128  MCV 92  MCH 31.1  MCHC 33.8  RDW 13.7  Neutrophil % 58.1  Lymphocyte % 22.2  Monocyte % 16.6  Eosinophil % 2.7  Basophil % 0.4  Neutrophil # 6.1  Lymphocyte # 2.3  Monocyte # 1.7  Eosinophil # 0.3  Basophil # 0.0  Result(s) reported on 12 Dec 2012 at [facility].    14-May-14 04:48, CBC Profile  WBC (CBC) 10.8  RBC (CBC) 2.55  Hemoglobin (CBC) 7.9  Hematocrit (CBC) 22.7  Platelet Count (CBC) 135  MCV 89  MCH 30.9  MCHC 34.6  RDW 15.2  Neutrophil % 60.7  Lymphocyte % 20.8  Monocyte % 13.9  Eosinophil % 4.2  Basophil % 0.4  Neutrophil # 6.6  Lymphocyte # 2.3  Monocyte # 1.5  Eosinophil # 0.5  Basophil # 0.0  Result(s) reported on 13 Dec 2012 at 05:52AM.    15-May-14 05:08, CBC Profile  WBC (CBC) 14.3  RBC (CBC) 2.39  Hemoglobin (CBC)  7.3  Hematocrit (CBC) 21.3  Platelet Count (CBC) 126  MCV 89  MCH 30.6  MCHC 34.4  RDW 14.7  Neutrophil % 77.6  Lymphocyte % 11.1  Monocyte % 10.6  Eosinophil % 0.6  Basophil % 0.1  Neutrophil # 11.1  Lymphocyte # 1.6  Monocyte # 1.5  Eosinophil # 0.1  Basophil # 0.0  Result(s) reported on 14 Dec 2012 at 05:52AM.    16-May-14 04:12, CBC Profile  WBC (CBC) 15.1  RBC (CBC) 2.67  Hemoglobin (CBC) 7.9  Hematocrit (CBC) 23.9  Platelet Count (CBC) 137  MCV 90  MCH 29.7  MCHC 33.2  RDW 15.0  Neutrophil % 72.6  Lymphocyte % 11.2  Monocyte % 14.8  Eosinophil % 1.1  Basophil % 0.3  Neutrophil # 10.9  Lymphocyte # 1.7  Monocyte # 2.2  Eosinophil # 0.2  Basophil # 0.0  Result(s) reported on 15 Dec 2012 at 05:57AM.   ASSESSMENT AND PLAN:  Assessment/Admission Diagnosis worsening renal failure   Plan dialysis catheter placement   Electronic Signatures: Algernon Huxley (MD)  (Signed 16-May-14 17:16)  Authored: Chief Complaint and History, PAST MEDICAL/SURGICAL HISTORY, ALLERGIES, HOME MEDICATIONS, Family and Social History, Review of Systems, Physical Exam, LABS, Assessment and Plan   Last Updated: 16-May-14 17:16 by Algernon Huxley (MD)

## 2014-11-22 NOTE — H&P (Signed)
PATIENT NAME:  Katelyn [name], Katelyn [name] MR#:  [id number] DATE OF BIRTH:  [birth year]  DATE OF ADMISSION:  12/10/2012  GASTROENTEROLOGIST: Dr. Rush Barer at [facility].  PRIMARY PHYSICIAN:  Dr. Hillery Aldo at [facility].   REFERRING PHYSICIAN: Dr.  Dorothea Glassman.   CHIEF COMPLAINT: Abdominal pain and increase of abdominal girth.   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 The patient does not have a result of this and she has not had a follow up. She also has history of hypertension, diabetes and today she was brought because of the patient having increase of abdominal pain. The last time that she was here the patient had altered mental status and this was related to hepatic encephalopathy. At this moment, the patient has increase of abdominal pain, worsening ascites, worsening kidney function.  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 The patient is admitted for treatment of these conditions and also for a diagnostic and therapeutic paracentesis.  The patient states that she has been having some more swelling of the lower extremities, as well as increased shortness of breath due to pressure of the abdomen. She had a fever, low-grade, yesterday and today. She had chills, she had abdominal pain and she has been having at least 3 BMs a day.  The pain is sharp and dull epigastric and then diffuse all over her abdomen, 7out of 10 in intensity with no radiation. The patient also was found to have a very low blood sugar of 32 whenever she came. Overall she states that her blood sugars have been controlled. The patient admitted for evaluation.   REVIEW OF SYSTEMS: A 12-system review of system is done.    CONSTITUTIONAL: The patient has fever, fatigue, weakness, abdominal pain and significant weight gain due  to fluid retention.  ENT: No tinnitus. No difficulty swallowing. No significant postnasal drip.  RESPIRATORY: No cough. Positive shortness of breath due to ascites. No hemoptysis. No chronic obstructive pulmonary disease. No painful respirations.  CARDIOVASCULAR: No chest pain. No orthopnea, no syncope. Positive edema of the lower extremities.   GASTROINTESTINAL: Increased abdominal pain, increased nausea, no vomiting. Please see history of present illness.  GENITOURINARY: No dysuria or hematuria. The patient states that she has been urinating less than usual.  ENDOCRINE: No polyuria, polydipsia or polyphagia. Positive low blood sugars. No cold or heat intolerance.  MUSCULOSKELETAL: No significant back pain or neck pain. Positive swelling of legs, but no joints. No gout.  NEUROLOGIC: No numbness, tingling. No CVA. No transient ischemic attack.  PSYCHIATRIC: No significant anxiety or depression or agitation.   PAST MEDICAL HISTORY: 1.  Liver sarcoid.  2.  Hepatitis C.  3.  Possible cirrhosis.  4.  Hypertension.  5.  Diabetes.  6.  History of hepatic mass, recently biopsied. No diagnosis yet.  SURGICAL HISTORY:   1.  Biopsy with ultrasound guided microwave ablation on March 11, at [facility].  2.  BTL.   FAMILY HISTORY: Positive for diabetes and hypertension in parents and breast cancer runs in her family.   SOCIAL HISTORY: The patient does not smoke, does not drink, does not have any history of drug abuse. She lives with her daughters.   ALLERGIES: CONTRAST DYE.   CURRENT MEDICATIONS: Zoloft 50 mg once daily, Sinemet 25/100 mg twice daily, oxycodone 5 mg  4 times a day, Neutra-Phos one packet twice daily, Mylanta 400 mg 3 times a day p.r.n., multivitamins once daily, losartan 50 mg once a day, loperamide 2 mg 4 times a day, lactulose 30 mL twice daily, insulin lantus 50 units subcutaneously twice daily, aspart sliding scale, hydrochlorothiazide with losartan 25/100 once a day, Haldol 5 mg  p.r.n., glipizide XL 10 mg once daily, atenolol 100 mg once a day, aspirin 81 mg once daily, Anusol p.r.n.   PHYSICAL EXAMINATION: VITAL SIGNS: Blood pressure 124/67, pulse 100, respirations of 26 to 22, temperature 99.7. GENERAL:  Alert and oriented x3. No acute distress. No respiratory distress. Hemodynamically stable.  HEENT: Pupils are equal and reactive. Extraocular movements are intact. Mucosae are moist. Anicteric sclerae. Pink conjunctivae. No oral lesions. No jaundice, normocephalic, atraumatic.  NECK: Supple. No JVD. No thyromegaly. No adenopathy. No carotid bruits.  CARDIOVASCULAR: Regular rate and rhythm. No murmurs, rubs or gallops. No displacement of PMI.  LUNGS: Clear without any wheezing or crepitus. No use of accessory muscles. Decreased respiratory sounds on the lower lobes.  ABDOMEN: Distended with significant ascites, tender to palpation of epigastric area. Unable to palpate hepatosplenomegaly, unable to palpate masses. No rebound, but significant pain to palpation diffusely.  GENITAL: Negative for external lesions.  EXTREMITIES: No cyanosis or clubbing. Positive edema +3.  NEUROLOGIC: Cranial nerves II through XII intact. No focal findings.  PSYCHIATRIC: Mood is normal without any signs of depression or agitation, vascular pulses +2.  LYMPHATICS: Negative for lymphadenopathy in the neck or supraclavicular areas.  SKIN: No significant petechiae, rashes.  MUSCULOSKELETAL: No significant joint deformity. No joint effusions.   LABORATORY, DIAGNOSTIC AND RADIOLOGICAL DATA: Glucose 32, BNP 192, BUN 26, creatinine is 2.29. Previous admission was 1.49, prior to that 122.   Chloride 111, CO2 18, lipase 759. Total protein of 8.7, bilirubin 1, alkaline phosphatase 206. T4, AST is 66 prior to that, AST was 82. Troponin is 0.02. TSH 1.34. White count 13.2. Hemoglobin 8.7, platelets 161, INR is 1.4. Urinalysis pending.   CT scan of the abdomen and pelvis show diffuse ascites, cirrhotic  changes of the liver, small fossae of intraperitoneal air, likely sequela of surgery. There are no signs of pancreatitis.   ASSESSMENT AND PLAN: This is a 64 year old female with history of cirrhosis, liver sarcoid, mass on the leaving recently removed, hypertension, diabetes, comes with increased abdominal pain.  1.  Abdominal pain, likely due to increase in size, but also highly possible to have spontaneous bacterial peritonitis. I am going to order a paracentesis ultrasound-guided. Her INR is 1.4, so she should be able to get it, but I am going to give her 5 mg of vitamin K anyway. The patient has fever and increased white blood count and significant abdominal pain for what is likely for the patient to have spontaneous bacterial peritonitis. We are going to order a Septicon  to start today. The paracentesis going to be therapeutic diagnosis. Since the patient has severe kidney failure,  I am going to give her albumin and not draining more than 8 liters tomorrow.  2.  Acute kidney injury. The patient has increase of creatinine, which has been actually increasing steadily for a while. I think this might be related to hepatorenal syndrome for what I am going to get a consultation from nephrology. I am not going to give her any IV fluids right now, since the patient is severely fluid overloaded. If her blood pressure is low or if there is any doubts we  are going to give her albumin.  3.  Systemic inflammatory response syndrome. The patient has elevated white blood count elevated heart rate, respiratory rate, likely due to spontaneous bacterial peritonitis, continue ceftriaxone.   4.  Diabetes. The patient presented with severe hypoglycemia. At this moment, her blood sugar is better. We are going to continue with a close monitoring every four hours. If her blood sugar continues to drop, I am going to give her D5, but for now, I am going to just started on a very low rate and monitor her closely. We are going to  hold on her insulin. We are going to hold on her glipizide XL.  5.  Acute kidney injury. Again pararenal syndrome could be a possibility. I am going to hold the losartan and hydrochlorothiazide about avoid diuretics and right now.  6.  Metabolic acidosis. Likely due to liver failure and also due to infection. Consider use of bicarbonate if necessary.  7.  Elevated lipase. This is likely due to severe ascites from liver failure. I do not see any signs of pancreatitis on the CT scan, for which the patient can have a regular diet.  8.  Very low albumin of due to Protek malnutrition moderate and liver failure.  9.  Elevated liver function tests due to liver failure. They are overall stable.  10.  Anemia. The patient has anemia of chronic disease, but also could be having significant gastrointestinal bleeding, for what I am going to order Widex. At this moment, she seems hemodynamically stable for what I think that she could use prophylaxis for deep vein thrombosis with low dose heparin.   CODE STATUS:  The patient is a full code.   TIME SPENT: I spent an hour with this patient    ____________________________ Felipa Furnaceoberto Sanchez Gutierrez, MD rsg:cc D: 12/10/2012 17:17:26 ET T: 12/10/2012 18:11:53 ET JOB#: [id number]  cc: Felipa Furnaceoberto Sanchez Gutierrez, MD, <Dictator> Clarance Bollard Juanda ChanceSANCHEZ GUTIERRE MD ELECTRONICALLY SIGNED 12/11/2012 12:14

## 2014-11-22 NOTE — Consult Note (Signed)
Referring Physician:  Albertine Patricia   Primary Care Physician:  Albertine Patricia [facility], [address] [place], [place] [address], [place] [phone]  Reason for Consult: Admit Date: 20-Oct-2012  Chief Complaint: altered mental status  Reason for Consult: altered mental status   History of Present Illness: History of Present Illness:   64 yo RHD F with hx of liver dysfunction comes in with altered mental status and elevated ammonia.  Pt had several doses of lactulose but the confusion continued and now pt was having diarrhea so it had to be stopped.  Pt is more alert lately but still not back to baseline.  ROS:  Review of Systems   unobtainable secondary to confusion  Past Medical/Surgical Hx:  alopecia:   DVT - Deep Vein Thrombosis:   Sarcoid Liver Involvement:   Hypothyroidism:   Hepatitis C:   neuropathy:   Hypercholesterolemia:   HTN:   Diabetic:   Past Medical/ Surgical Hx:  Past Medical History as above   Past Surgical History as above   Home Medications: Medication Instructions Last Modified Date/Time  atenolol 16m 1 tab(s)  once a day  17-Mar-14 02:56  Humalog KwikPen 100 units/mL subcutaneous solution 10 unit(s) subcutaneous before small meals 17-Mar-14 02:56  Humalog KwikPen 100 units/mL subcutaneous solution 16 unit(s) subcutaneous before large meals 17-Mar-14 02:56  GlipiZIDE XL 10 mg oral tablet, extended release 1 tab(s) orally once a day 17-Mar-14 02:56  meloxicam 15 mg oral tablet 1 tab(s) orally once a day 17-Mar-14 02:56  hydrOXYzine hydrochloride 25 mg oral tablet 1 tab(s) orally once a day (at bedtime) 17-Mar-14 02:56  gabapentin 800 mg oral tablet 1 tab(s) orally 3 times a day 17-Mar-14 02:56  metformin extended release 500 mg oral tablet, extended release 2 tab(s) orally 2 times a day 17-Mar-14 02:56  potassium gluconate 595 mg oral tablet 1 tab(s) orally once a day 17-Mar-14 02:56  Co Q-10 100 mg oral  capsule 1 cap(s) orally once a day 17-Mar-14 02:56  multivitamin with iron 1 tab(s) orally once a day 17-Mar-14 02:56  furosemide 20 mg oral tablet 1 tab(s) orally once a day 17-Mar-14 02:56  oxycodone 5 mg oral tablet 1 tab(s) orally 4 times a day, As Needed- for Pain  17-Mar-14 02:56  Aspir 81 81 mg oral tablet 1 tab(s) orally once a day 17-Mar-14 02:56  amlodipine 2.5 mg oral tablet 1 tab(s) orally once a day 17-Mar-14 02:56  Levemir FlexPen 100 units/mL subcutaneous solution 0.5 milliliter(s) subcutaneous 2 times a day 17-Mar-14 02:56  sertraline 100 mg oral tablet 1 tab(s) orally once a day 17-Mar-14 02:56  hydrochlorothiazide-losartan 25 mg-100 mg oral tablet 1 tab(s) orally once a day 17-Mar-14 02:56   Allergies:  No Known Allergies:   Allergies:  Allergies NKDA   Social/Family History: Employment Status: disabled  Lives With: children  Living Arrangements: house  Social History: no tob, no EtOH, no illicits  Family History: n/c   Vital Signs: **Vital Signs.:   26-Mar-14 09:24  Vital Signs Type Q 4hr  Temperature Temperature (F) 98.6  Celsius 37  Temperature Source oral  Pulse Pulse 92  Respirations Respirations 18  Systolic BP Systolic BP 1518 Diastolic BP (mmHg) Diastolic BP (mmHg) 84  Mean BP 104  Pulse Ox % Pulse Ox % 98  Pulse Ox Activity Level  At rest  Oxygen Delivery Room Air/ 21 %   Physical Exam: General: obese, resting in bed, NAD  HEENT: normocephalic, sclera  nonicteric, oropharynx clear  Neck: supple, no JVD, no bruits  Chest: CTA B, no wheezing, good movement  Cardiac: RRR, no murmurs, no edema, 2+ pulses  Extremities: 2+ edema, no C/C   Neurologic Exam: Mental Status: confused but oriented to place and person, follows commands that are simple, mild dysarthria, moderate bradyphrenia  Cranial Nerves: PERRLA, EOMI, nl VF, face symmetric, tongue midline, shoulder shrug equal  Motor Exam: 2/5 B, + asterixis, moderate increased tone with bradykinesia   Deep Tendon Reflexes: 0/4 B, downgoing plantars  Sensory Exam: intact to light touch  Coordination: untestable due to weakness   Lab Results: Thyroid:  20-Mar-14 11:43   Thyroid Stimulating Hormone 0.956 (0.45-4.50 (International Unit)  ----------------------- Pregnant patients have  different reference  ranges for TSH:  - - - - - - - - - -  Pregnant, first trimetser:  0.36 - 2.50 uIU/mL)  Hepatic:  24-Mar-14 07:46   Bilirubin, Total 1.0  Alkaline Phosphatase  143  SGPT (ALT) 72  SGOT (AST)  82  Total Protein, Serum 6.5  Albumin, Serum  1.5  Routine Micro:  20-Mar-14 11:43   Micro Text Report BLOOD CULTURE   COMMENT                   NO GROWTH AEROBICALLY/ANAEROBICALLY IN 5 DAYS   ANTIBIOTIC                       Specimen Source 1st set l arm,  Culture Comment NO GROWTH AEROBICALLY/ANAEROBICALLY IN 5 DAYS  Result(s) reported on 24 Oct 2012 at 09:09PM.  General Ref:  21-JHE-17 40:81   Salicylates, Serum < 1.7 (0.0-2.8 Therapeutic 2.8-20.0 mg/dL Toxic >30.0 mg/dL)  Acetaminophen, Serum < 2.0 (10-30 POTENTIALLY TOXIC:  > 200 mcg/mL  > 50 mcg/mL at 12 hr after  ingestion  > 300 mcg/mL at 4 hr after  ingestion)  25-Mar-14 15:20   CRP, High Sensitivity ========== TEST NAME ==========  ========= RESULTS =========  = REFERENCE RANGE =  CRP, HIGH SENSITIVITY  C-Reactive Protein, Cardiac C-Reactive Protein, Cardiac     [H  99.85 mg/L           ]         0.00-3.00                         Relative Risk for Future Cardiovascular Event                                             Low                 <1.00                                             Average       1.00 - 3.00                                             High                >3.00  [facility]            No: [phone]           [address], [place], [place] [address]           Lindon Romp, MD         [phone]   Result(s) reported on 25 Oct 2012 at 06:17AM.  Routine  Chem:  17-Mar-14 00:39   Ethanol, S. < 3  Ethanol % (comp) < 0.003 (Result(s) reported on 16 Oct 2012 at 01:31AM.)  22-Mar-14 06:40   Magnesium, Serum 1.8 (1.8-2.4 THERAPEUTIC RANGE: 4-7 mg/dL TOXIC: > 10 mg/dL  -----------------------)    17:22   Result Comment POTASSIUM - Slight hemolysis, interpret results with  - caution.  Result(s) reported on 21 Oct 2012 at 05:50PM.  24-Mar-14 07:46   Glucose, Serum  153  BUN 16  Creatinine (comp)  1.38  Sodium, Serum  135  Potassium, Serum 3.7  Chloride, Serum  108  CO2, Serum 21  Calcium (Total), Serum  7.3  Osmolality (calc) 274  eGFR (African American)  48  eGFR (Non-African American)  41 (eGFR values <2mL/min/1.73 m2 may be an indication of chronic kidney disease (CKD). Calculated eGFR is useful in patients with stable renal function. The eGFR calculation will not be reliable in acutely ill patients when serum creatinine is changing rapidly. It is not useful in  patients on dialysis. The eGFR calculation may not be applicable to patients at the low and high extremes of body sizes, pregnant women, and vegetarians.)  Anion Gap  6    07:47   Ammonia, Plasma 26 (Result(s) reported on 23 Oct 2012 at 09:29AM.)  50-TUU-82 80:03   Folic Acid, Serum 49.1 (Result(s) reported on 24 Oct 2012 at 04:18PM.)  Urine Drugs:  79-XTA-56 97:94   Tricyclic Antidepressant, Ur Qual (comp) NEGATIVE (Result(s) reported on 15 Oct 2012 at 11:55PM.)  Amphetamines, Urine Qual. NEGATIVE  MDMA, Urine Qual. NEGATIVE  Cocaine Metabolite, Urine Qual. NEGATIVE  Opiate, Urine qual NEGATIVE  Phencyclidine, Urine Qual. NEGATIVE  Cannabinoid, Urine Qual. NEGATIVE  Barbiturates, Urine Qual. NEGATIVE  Benzodiazepine, Urine Qual. NEGATIVE (----------------- The URINE DRUG SCREEN provides only a preliminary, unconfirmed analytical test result and should not be used for non-medical  purposes.  Clinical consideration and professional judgment should be  applied to  any positive drug screen result due to possible interfering substances.  A more specific alternate chemical method must be used in order to obtain a confirmed analytical result.  Gas chromatography/mass spectrometry (GC/MS) is the preferred confirmatory method.)  Methadone, Urine Qual. NEGATIVE  Cardiac:  16-Mar-14 23:12   CK, Total  1528  CPK-MB, Serum  48.3 (Result(s) reported on 15 Oct 2012 at 11:56PM.)    23:50   Troponin I < 0.02 (0.00-0.05 0.05 ng/mL or less: NEGATIVE  Repeat testing in 3-6 hrs  if clinically indicated. >0.05 ng/mL: POTENTIAL  MYOCARDIAL INJURY. Repeat  testing in 3-6 hrs if  clinically indicated. NOTE: An increase or decrease  of 30% or more on serial  testing suggests a  clinically important change)  Routine UA:  22-Mar-14 23:40   Color (UA) Yellow  Clarity (UA) Hazy  Glucose (UA) 150 mg/dL  Bilirubin (UA) Negative  Ketones (UA) Negative  Specific Gravity (UA) 1.030  Blood (UA) 2+  pH (UA) 5.0  Protein (UA) 30 mg/dL  Nitrite (UA) Negative  Leukocyte Esterase (UA) Trace (Result(s) reported on 22 Oct 2012 at 12:02AM.)  RBC (UA) 18 /HPF  WBC (  UA) 5 /HPF  Bacteria (UA) TRACE  Epithelial Cells (UA) 1 /HPF  Mucous (UA) PRESENT (Result(s) reported on 22 Oct 2012 at 12:02AM.)  Routine Coag:  22-Mar-14 19:59   Prothrombin  17.7  INR 1.5 (INR reference interval applies to patients on anticoagulant therapy. A single INR therapeutic range for coumarins is not optimal for all indications; however, the suggested range for most indications is 2.0 - 3.0. Exceptions to the INR Reference Range may include: Prosthetic heart valves, acute myocardial infarction, prevention of myocardial infarction, and combinations of aspirin and anticoagulant. The need for a higher or lower target INR must be assessed individually. Reference: The Pharmacology and Management of the Vitamin K  antagonists: the seventh ACCP Conference on Antithrombotic and Thrombolytic Therapy.  HRCBU.3845 Sept:126 (3suppl): [id number]. A HCT value >55% may artifactually increase the PT.  In one study,  the increase was an average of 25%. Reference:  "Effect on Routine and Special Coagulation Testing Values of Citrate Anticoagulant Adjustment in Patients with High HCT Values." American Journal of Clinical Pathology 2006;126:400-405.)  Activated PTT (APTT) 26.1 (A HCT value >55% may artifactually increase the APTT. In one study, the increase was an average of 19%. Reference: "Effect on Routine and Special Coagulation Testing Values of Citrate Anticoagulant Adjustment in Patients with High HCT Values." American Journal of Clinical Pathology 2006;126:400-405.)  Routine Hem:  24-Mar-14 07:46   WBC (CBC)  13.8  RBC (CBC)  2.98  Hemoglobin (CBC)  9.2  Hematocrit (CBC)  28.0  Platelet Count (CBC)  94  MCV 94  MCH 30.8  MCHC 32.8  RDW 13.7  Neutrophil % 67.6  Lymphocyte % 15.6  Monocyte % 13.6  Eosinophil % 3.0  Basophil % 0.2  Neutrophil #  9.4  Lymphocyte # 2.2  Monocyte #  1.9  Eosinophil # 0.4  Basophil # 0.0 (Result(s) reported on 23 Oct 2012 at 08:19AM.)  25-Mar-14 15:20   Erythrocyte Sed Rate  104 (Result(s) reported on 24 Oct 2012 at 04:47PM.)   Radiology Results: CT:    17-Mar-14 00:13, CT Head Without Contrast  CT Head Without Contrast   REASON FOR EXAM:    unresponsive  COMMENTS:       PROCEDURE: CT  - CT HEAD WITHOUT CONTRAST  - Oct 16 2012 12:13AM     RESULT: Comparison:  None    Technique: Multiple axial images from the foramen magnum to the vertex   were obtained without IV contrast.    Findings:      Examination is limited secondary to patient motion degrading image   quality.  There is no evidence of mass effect, midline shift, or extra-axial fluid   collections.  There is no evidence of a space-occupying lesion or   intracranial hemorrhage. There is no evidence of a cortical-based area of   acute infarction. There are right basal ganglia  calcifications. There is   periventricular white matter low attenuation likely secondary to   microangiopathy.    The ventricles andsulci are appropriate for the patient's age. The basal   cisterns are patent.    Visualized portions of the orbits are unremarkable. The visualized   portions of the paranasal sinuses and mastoid air cells are unremarkable.     The osseous structuresare unremarkable.  IMPRESSION:      Limited evaluation secondary to patient motion degrading image quality.   Repeat sequences also have motion. If there is further clinical concern   recommend repeat CT of the brain.  Otherwise no acute intracranial process.        Dictation Site: 1        Verified By: Jennette Banker, M.D., MD    20-Mar-14 11:03, CT Head Without Contrast  CT Head Without Contrast   REASON FOR EXAM:    AMS/Encephalopathy  COMMENTS:       PROCEDURE: CT  - CT HEAD WITHOUT CONTRAST  - Oct 19 2012 11:03AM     RESULT: Comparison:  10/16/2012    Technique: Multiple axial images from the foramen magnum to the vertex   were obtained without IV contrast.    Findings:      There is no evidence of mass effect, midline shift, or extra-axial fluid   collections.  There is no evidence of a space-occupying lesion or   intracranial hemorrhage. There is no evidence of a cortical-based area of    acute infarction. There is generalized cerebral atrophy. There is   periventricular white matter low attenuation likely secondary to   microangiopathy.    The ventricles and sulci are appropriate for the patient's age. The basal   cisterns are patent.    Visualized portions of the orbits are unremarkable. The visualized   portions of the paranasal sinuses and mastoid air cells are unremarkable.     The osseous structures are unremarkable.    IMPRESSION:      No acute intracranial process.  CTcan underestimate ischemia in the first 24 hours after the event. If   there is clinical concern for  an acute infarct, a followup MRI or repeat   CT scan in 24 hours may provide additional information.    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 and probably side effect of Zoloft as well change Zoloft to 41m daily add Sinemet 25/100 BID restart Lactulose 336mBID as tolerated start low protein diet agree with therapy can consider Ammonul if confusion persist EEG pending  will follow with you  Electronic Signatures: SmJamison NeighborMD)  (Signed 26-Mar-14 11:30)  Authored: REFERRING PHYSICIAN, Primary Care Physician, Consult, History of Present Illness, Review of Systems, PAST MEDICAL/SURGICAL HISTORY, HOME MEDICATIONS, ALLERGIES, Social/Family History, NURSING VITAL SIGNS, Physical Exam-, LAB RESULTS, RADIOLOGY RESULTS, Recommendations   Last Updated: 26-Mar-14 11:30 by SmJamison NeighborMD)

## 2014-11-22 NOTE — Op Note (Signed)
PATIENT NAME:  Katelyn [name], RAIDER MR#:  [id number] DATE OF BIRTH:  24-Sep-1950  DATE OF PROCEDURE:  10/21/2012  PREOPERATIVE DIAGNOSIS:  Encephalopathy.   POSTOPERATIVE DIAGNOSIS:  Encephalopathy.  PROCEDURE:  Right femoral vein central line placement.   SURGEON:  Richard E. Burt Knack, M.D.   ANESTHESIA:  Local anesthetic.   INDICATIONS:  This is a patient with hepatic encephalopathy requiring IV access which is not obtainable by other means.  Consent was obtained by the nurse from a family member via phone.   FINDINGS:  Central line placed easily in the right femoral vein on the first attempt.   DESCRIPTION OF PROCEDURE:  The patient was identified in the ICU.  A femoral pulse was easily palpable in the right groin.  The groin was chosen because of the patient's inability to cooperate due to delirium and the inability to give benzodiazepines, etc. and the risk of pneumothorax in this patient.   The patient was prepped and draped in a sterile fashion.  Cap and gowns and gloves were utilized.  Local anesthetic was infiltrated in skin and subcutaneous tissues in the right groin near the palpable femoral pulse and then on the first attempt a large-bore needle was placed into the right femoral vein.  A Seldinger wire was placed.  An incision was made.  The introducer dilator was utilized followed by placement of a previously flushed triple-lumen catheter over the Seldinger wire which was then removed.  Each lumen was flushed and irrigated for function and then the catheter was sutured to the skin of the anterior thigh with 3-0 silk which was provided in the kit and a sterile dressing was placed.  The patient tolerated the procedure well.  There were no complications.  She was left in stable condition in the ICU.    ____________________________ Jerrol Banana. Burt Knack, MD rec:ea D: 10/21/2012 21:18:03 ET T: 10/22/2012 07:04:47 ET JOB#: [id number]  cc: Jerrol Banana. Burt Knack, MD, <Dictator> Florene Glen  MD ELECTRONICALLY SIGNED 10/22/2012 19:44
